# Patient Record
Sex: Male | Born: 1945 | Race: White | Hispanic: No | Smoking: Never smoker
Health system: Southern US, Community
[De-identification: ages and names within clinical notes are randomized; demographics above are authoritative.]

## PROBLEM LIST (undated history)

## (undated) DIAGNOSIS — E785 Hyperlipidemia, unspecified: Secondary | ICD-10-CM

## (undated) DIAGNOSIS — I10 Essential (primary) hypertension: Secondary | ICD-10-CM

## (undated) HISTORY — DX: Essential (primary) hypertension: I10

## (undated) HISTORY — DX: Hyperlipidemia, unspecified: E78.5

---

## 2006-12-21 ENCOUNTER — Emergency Department (HOSPITAL_COMMUNITY): Admission: EM | Admit: 2006-12-21 | Discharge: 2006-12-21 | Payer: Self-pay | Admitting: Emergency Medicine

## 2006-12-21 IMAGING — CT CT PELVIS W/O CM
2 of 4 series · 17 of 46 positions shown, 19 images · non-contrast
Comparison: none

[DATE] – DUPLICATE COPY for exam association in [facility] – No change from original report.
CLINICAL DATA: Left flank pain

[Series 2: renal stone 5.0 b31f st · axial · 0.77mm/px · z∈[-442,+2]mm · 14 of 99 slices shown, 16 images]
[im 5/99  soft-tissue]
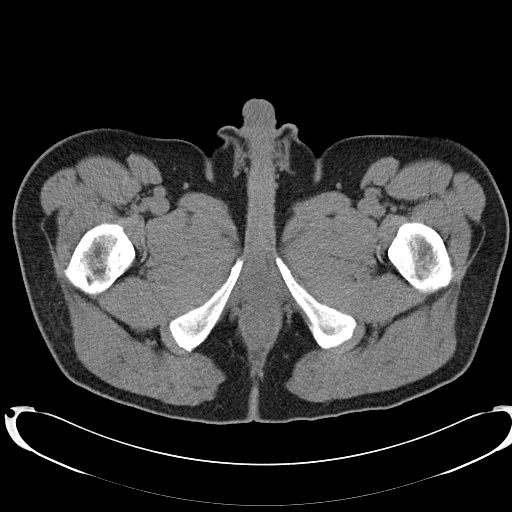
[im 5/99  bone]
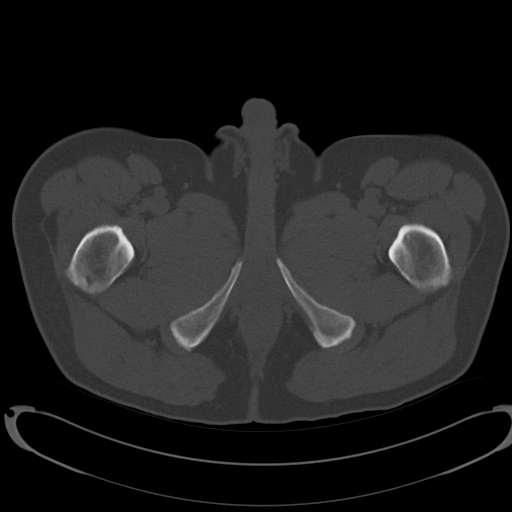
[im 13/99  soft-tissue]
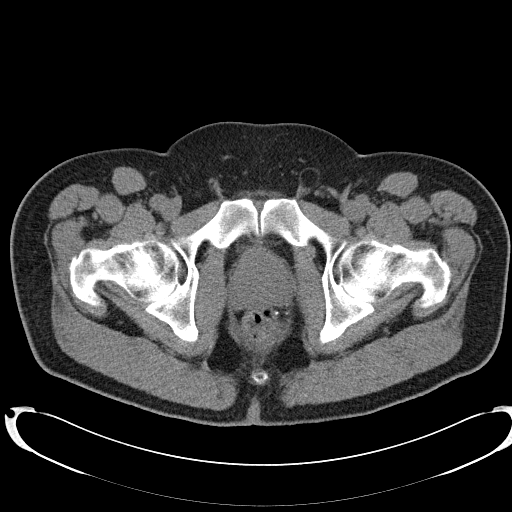
[im 18/99  soft-tissue]
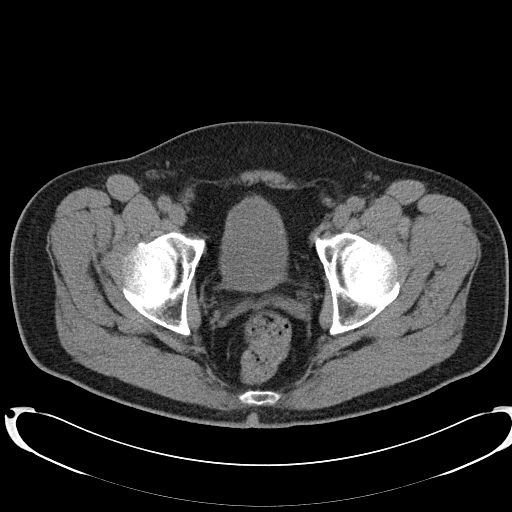
[im 26/99  soft-tissue]
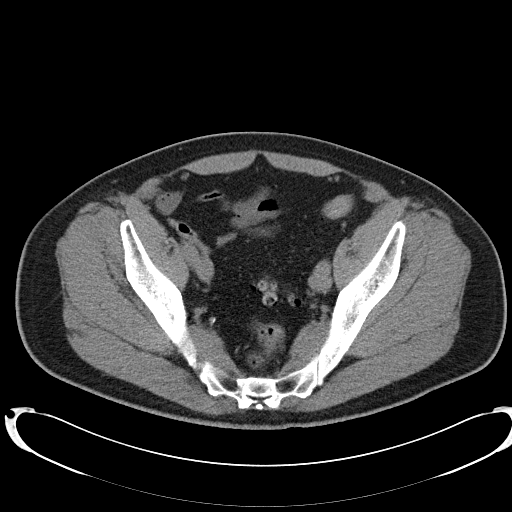
[im 35/99  soft-tissue]
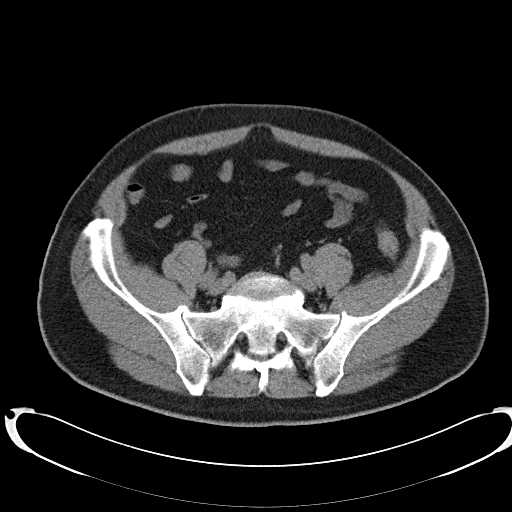
[im 39/99  soft-tissue]
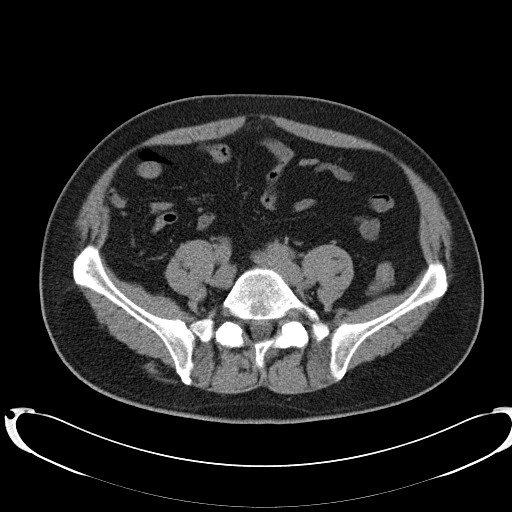
[im 47/99  soft-tissue]
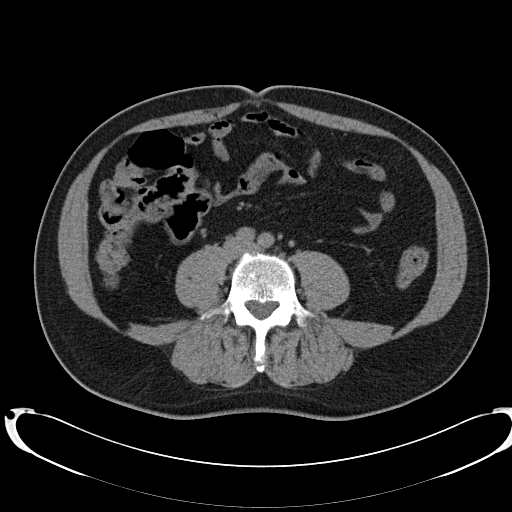
[im 52/99  soft-tissue]
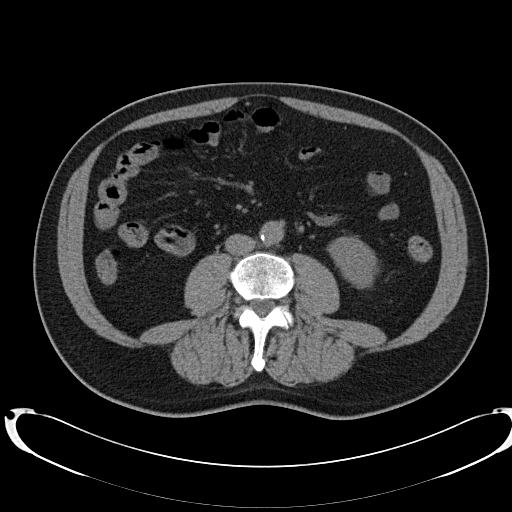
[im 60/99  soft-tissue]
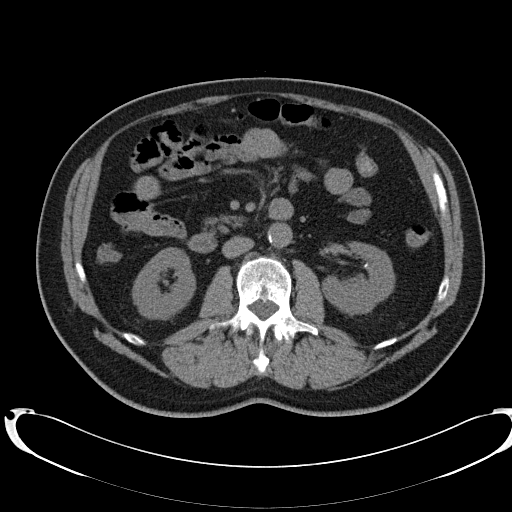
[im 60/99  bone]
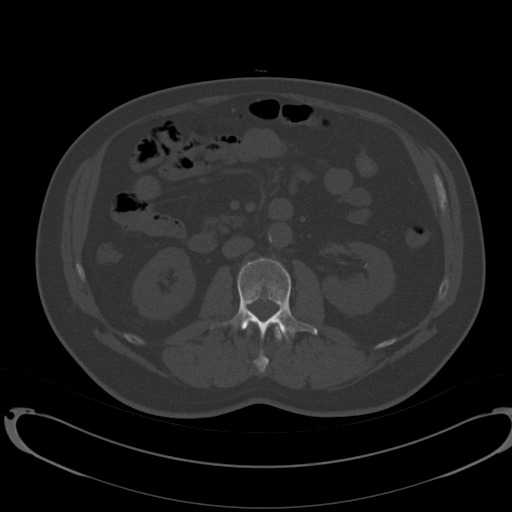
[im 64/99  soft-tissue]
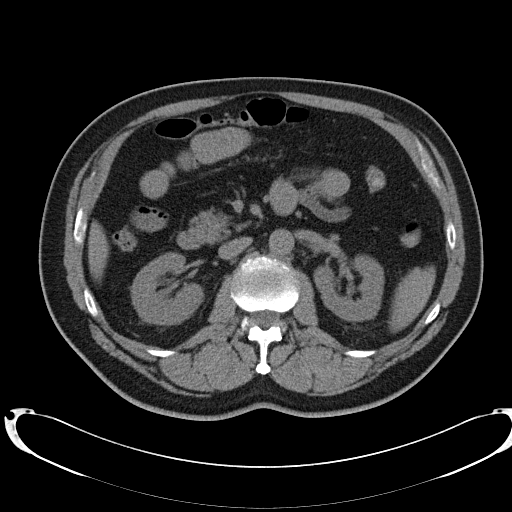
[im 73/99  soft-tissue]
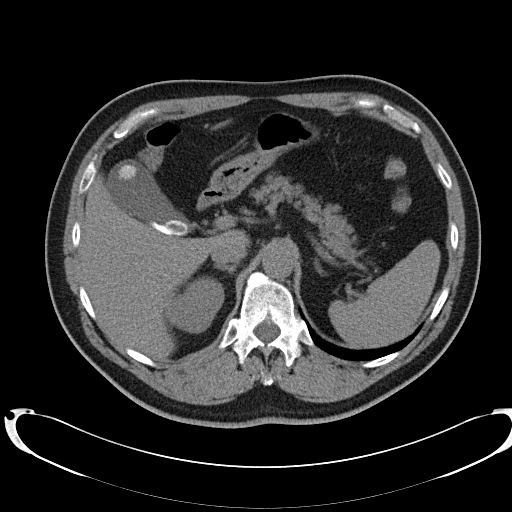
[im 81/99  soft-tissue]
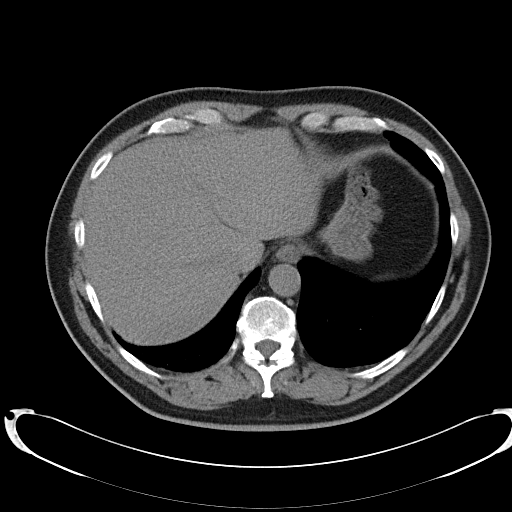
[im 86/99  soft-tissue]
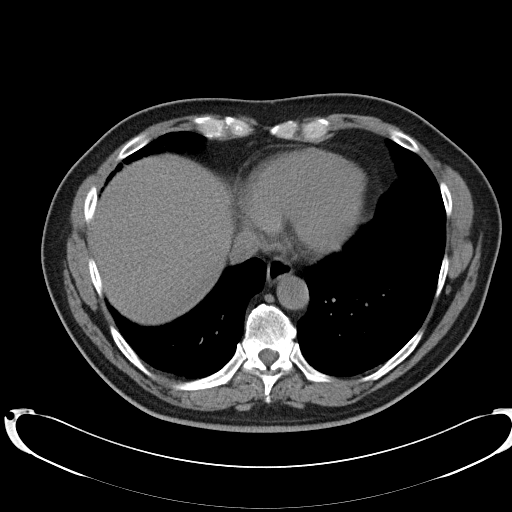
[im 94/99  soft-tissue]
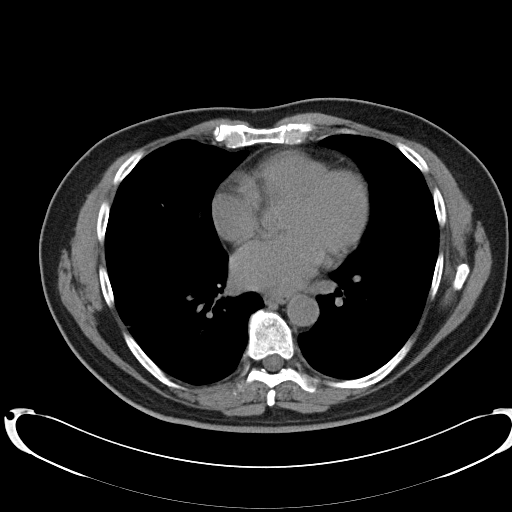

[Series 602: cor a/p · coronal · 0.97mm/px · 3 of 52 slices shown]
[im 18/52  soft-tissue]
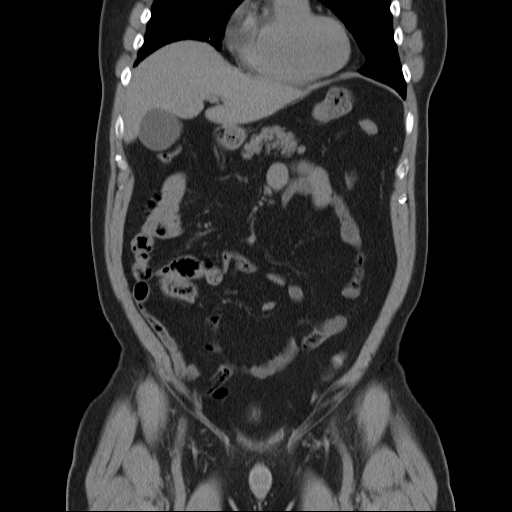
[im 23/52  soft-tissue]
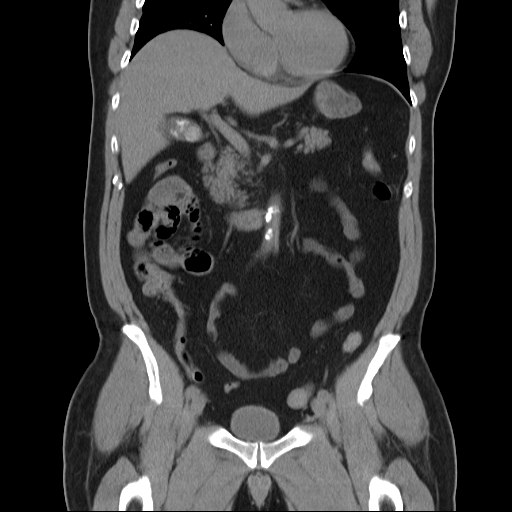
[im 29/52  soft-tissue]
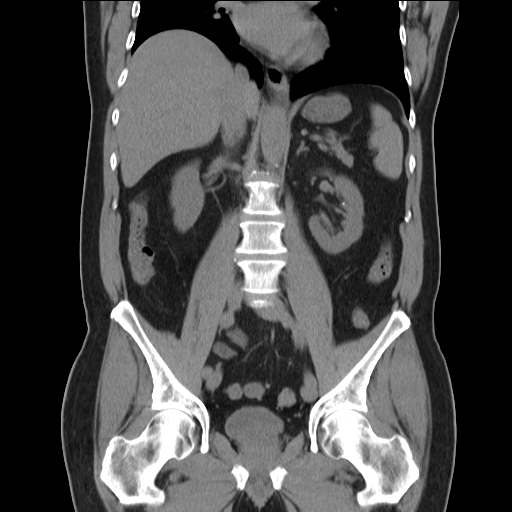

[17 of 46 positions shown; findings below may reference images not displayed]

CT abdomen without contrast:

 No previous for comparison. Dependent atelectasis in the posterior right lower
 lobe. Aortic and coronary calcifications noted. Partial calcified gallstones in
 the gallbladder. 3 mm calculus in the interpolar aspect of the left renal
 collecting system. No hydronephrosis or ureterectasis. Unremarkable noncontrast
 evaluation of spleen, liver, adrenal glands, pancreas, small bowel. No free air.
 No ascites. No adenopathy localized.
IMPRESSION: 1. Left nephrolithiasis without hydronephrosis.
 2. Cholelithiasis

 CT pelvis without contrast:

 Scattered descending and sigmoid diverticula without adjacent inflammatory or
 edematous change. Distal ureters decompressed without calculus. Urinary bladder
 incompletely distended. Moderate prostatic enlargement with central
 calcification. Bilateral pelvic vascular calcifications. No free fluid. No
 adenopathy localized.
IMPRESSION: 1. Descending and sigmoid diverticulosis without CT evidence of diverticulitis
 or abscess.
 2. Negative for distal ureteral calculus.

## 2006-12-21 IMAGING — CR DG ABDOMEN ACUTE W/ 1V CHEST
3 series · 3 of 3 positions shown · non-contrast
Comparison: CT abdomen and pelvis, same day.

CLINICAL DATA: Left-sided pain.
 ACUTE ABDOMINAL SERIES WITH CHEST ? 3 VIEWS ? [DATE]:

[w chest pa]
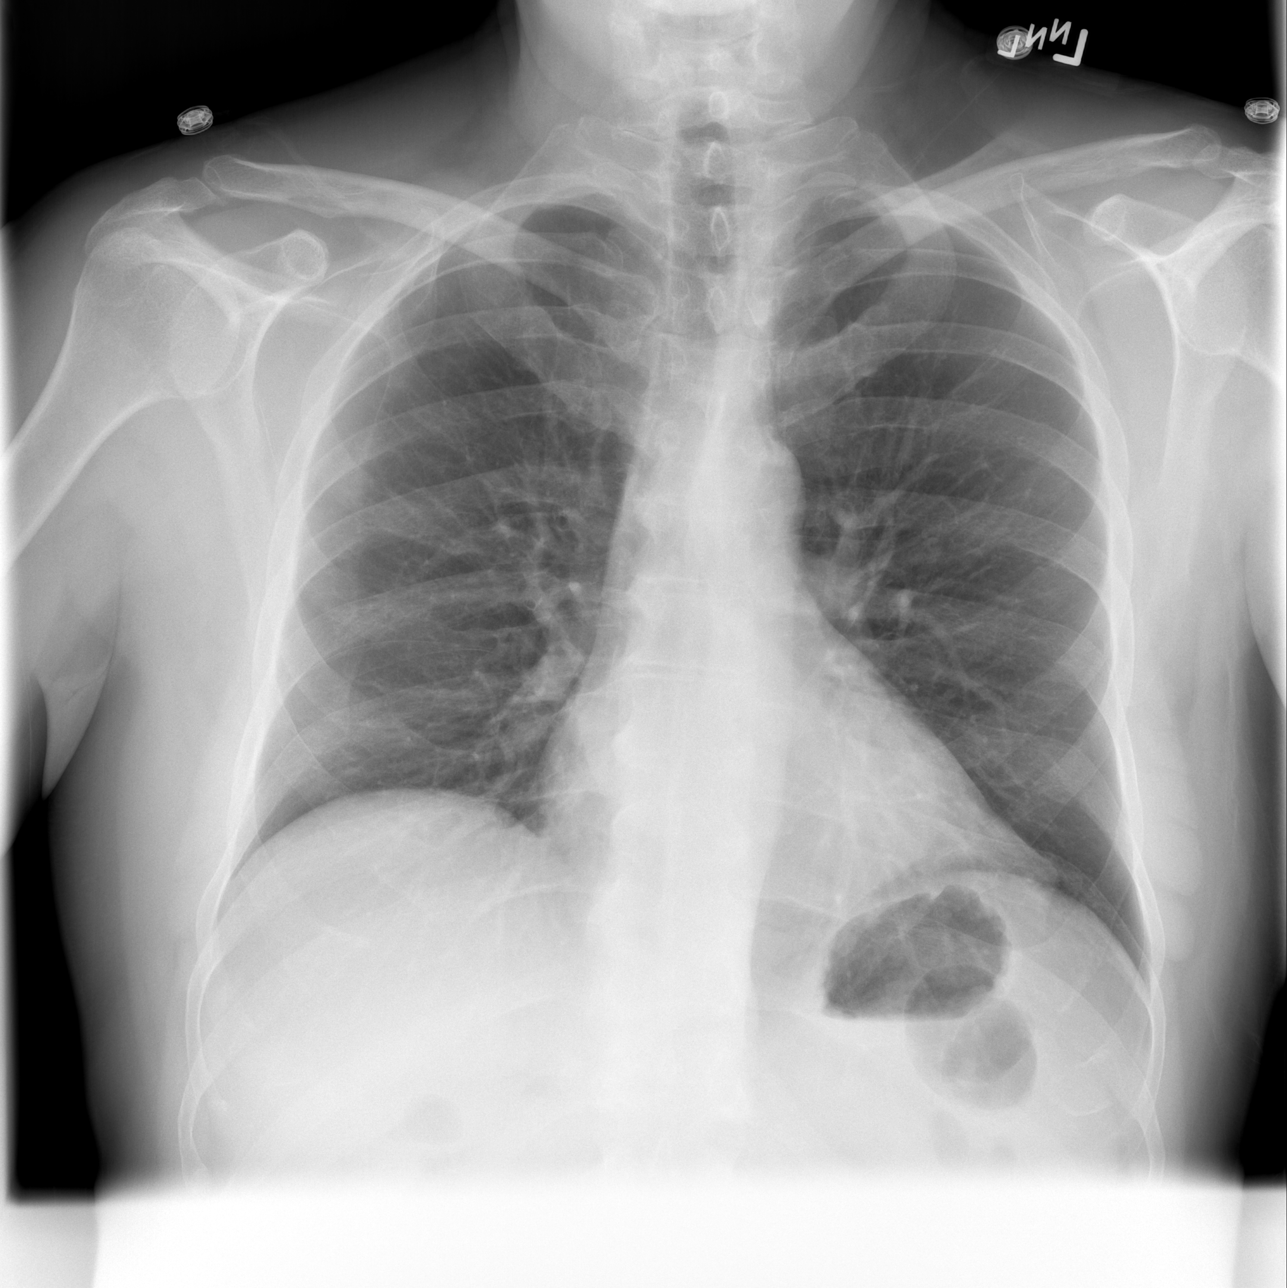

[w abdomen upright]
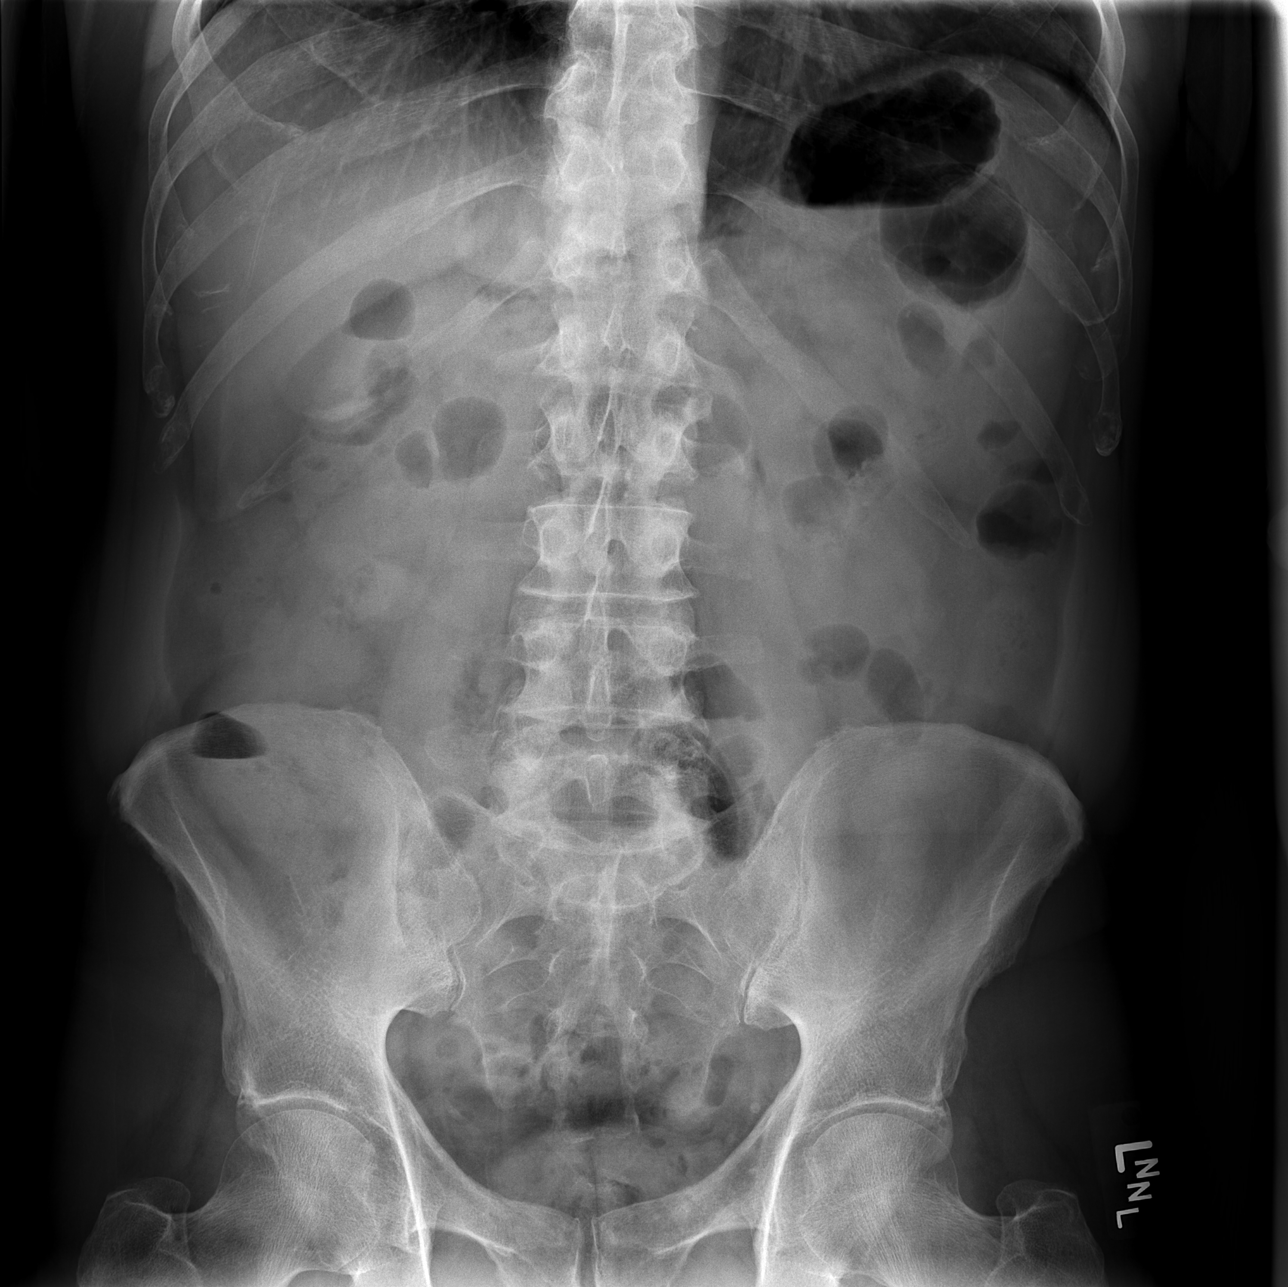

[t abdomen supine]
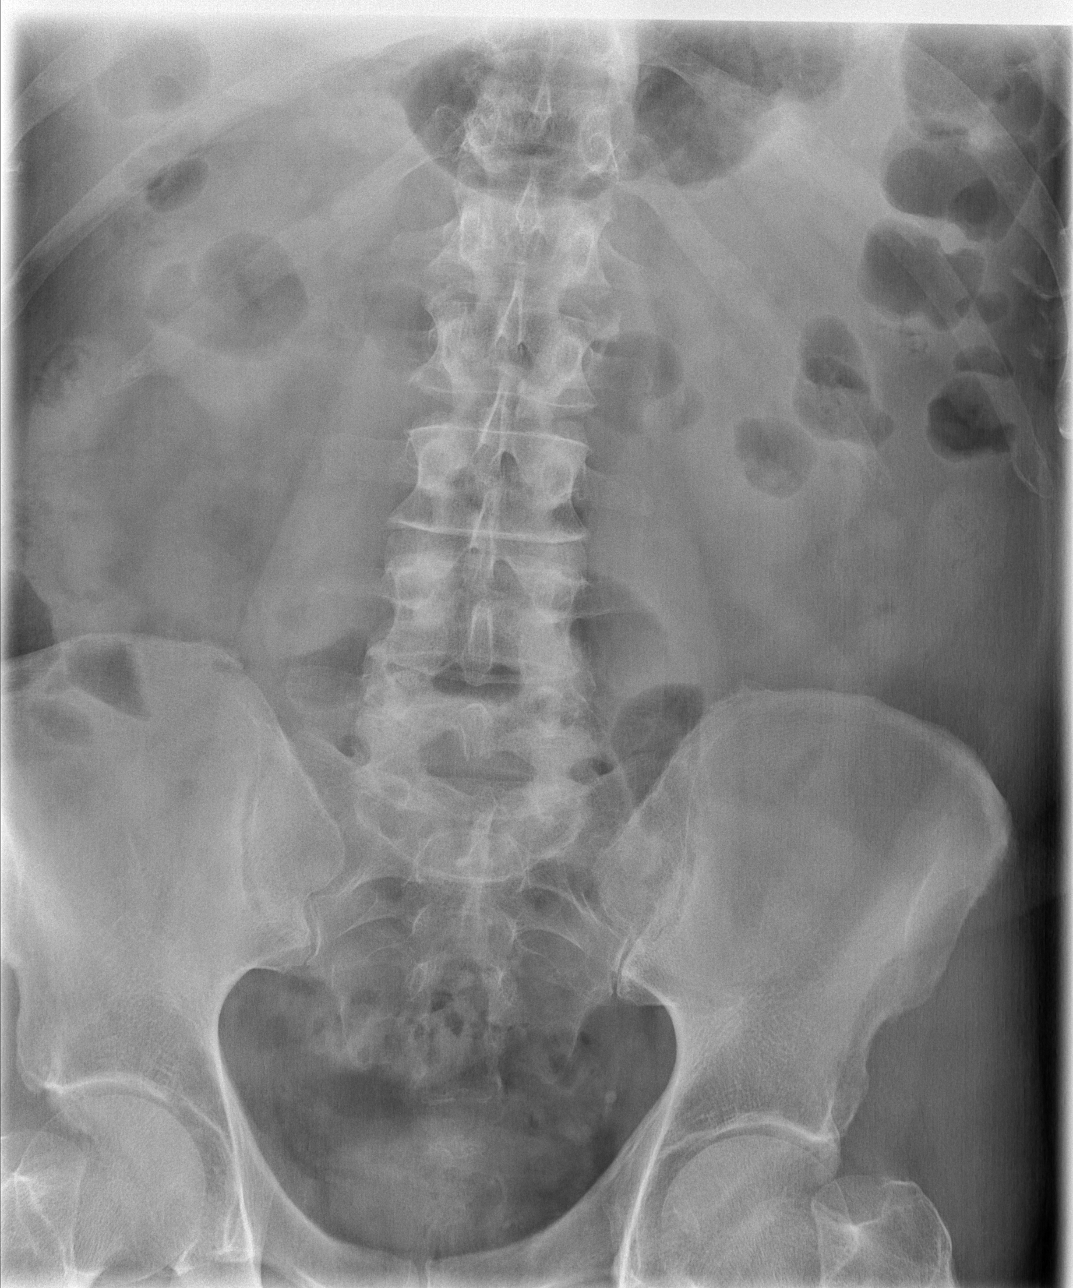

[3 of 3 positions shown; findings below may reference images not displayed]

FINDINGS: Normal mediastinum and cardiac silhouette. The costophrenic angles are clear.  Normal pulmonary vasculature.  No free air beneath the hemidiaphragms.  
 Supine and upright views of the abdomen demonstrate no dilated loops of small or large bowel.  Gas within the rectum.  Calcifications in the region of the gallbladder and midpole left kidney correspond to CT findings.
IMPRESSION: 1.  No acute cardiopulmonary process.
 2.  No evidence of bowel obstruction or free air.  
 3.  Likely gallstones and left renal nephrolithiasis corresponding to CT findings.

## 2010-12-27 LAB — DIFFERENTIAL
Basophils Absolute: 0
Eosinophils Relative: 1
Monocytes Absolute: 0.6
Monocytes Relative: 6

## 2010-12-27 LAB — COMPREHENSIVE METABOLIC PANEL
AST: 24
Alkaline Phosphatase: 53
BUN: 9
Creatinine, Ser: 0.9
GFR calc Af Amer: >60
GFR calc non Af Amer: >60
Glucose, Bld: 131 — ABNORMAL HIGH
Potassium: 3.7
Total Bilirubin: 1.2

## 2010-12-27 LAB — URINALYSIS, ROUTINE W REFLEX MICROSCOPIC
Nitrite: NEGATIVE
Protein, ur: 30 — AB
Specific Gravity, Urine: 1.028
Urobilinogen, UA: 1

## 2010-12-27 LAB — CBC
HCT: 46.8
MCV: 89.9
RBC: 5.2
RDW: 13.6
WBC: 10.2

## 2010-12-27 LAB — URINE MICROSCOPIC-ADD ON

## 2010-12-27 LAB — LIPASE, BLOOD: Lipase: 23

## 2015-04-12 ENCOUNTER — Ambulatory Visit (INDEPENDENT_AMBULATORY_CARE_PROVIDER_SITE_OTHER): Payer: Medicare Other | Admitting: Emergency Medicine

## 2015-04-12 ENCOUNTER — Emergency Department (HOSPITAL_COMMUNITY): Payer: Medicare Other

## 2015-04-12 ENCOUNTER — Ambulatory Visit: Payer: Self-pay

## 2015-04-12 ENCOUNTER — Ambulatory Visit (HOSPITAL_COMMUNITY): Payer: Self-pay

## 2015-04-12 ENCOUNTER — Emergency Department (HOSPITAL_COMMUNITY)
Admission: EM | Admit: 2015-04-12 | Discharge: 2015-04-12 | Disposition: A | Discharge status: Home / Self Care | Payer: Medicare Other | Attending: Emergency Medicine | Admitting: Emergency Medicine

## 2015-04-12 ENCOUNTER — Ambulatory Visit (INDEPENDENT_AMBULATORY_CARE_PROVIDER_SITE_OTHER): Payer: Medicare Other

## 2015-04-12 ENCOUNTER — Encounter (HOSPITAL_COMMUNITY): Payer: Self-pay | Admitting: *Deleted

## 2015-04-12 VITALS — BP 182/88 | HR 79 | Temp 97.6°F | Resp 18 | Ht 69.0 in | Wt 180.2 lb

## 2015-04-12 DIAGNOSIS — R030 Elevated blood-pressure reading, without diagnosis of hypertension: Secondary | ICD-10-CM

## 2015-04-12 DIAGNOSIS — N200 Calculus of kidney: Secondary | ICD-10-CM | POA: Diagnosis not present

## 2015-04-12 DIAGNOSIS — R1032 Left lower quadrant pain: Secondary | ICD-10-CM

## 2015-04-12 DIAGNOSIS — E785 Hyperlipidemia, unspecified: Secondary | ICD-10-CM | POA: Insufficient documentation

## 2015-04-12 DIAGNOSIS — N201 Calculus of ureter: Secondary | ICD-10-CM | POA: Diagnosis not present

## 2015-04-12 DIAGNOSIS — R109 Unspecified abdominal pain: Secondary | ICD-10-CM

## 2015-04-12 DIAGNOSIS — IMO0001 Reserved for inherently not codable concepts without codable children: Secondary | ICD-10-CM

## 2015-04-12 DIAGNOSIS — I10 Essential (primary) hypertension: Secondary | ICD-10-CM | POA: Insufficient documentation

## 2015-04-12 DIAGNOSIS — Z79899 Other long term (current) drug therapy: Secondary | ICD-10-CM | POA: Insufficient documentation

## 2015-04-12 DIAGNOSIS — R739 Hyperglycemia, unspecified: Secondary | ICD-10-CM | POA: Diagnosis not present

## 2015-04-12 DIAGNOSIS — E119 Type 2 diabetes mellitus without complications: Secondary | ICD-10-CM | POA: Insufficient documentation

## 2015-04-12 DIAGNOSIS — K8020 Calculus of gallbladder without cholecystitis without obstruction: Secondary | ICD-10-CM

## 2015-04-12 DIAGNOSIS — R110 Nausea: Secondary | ICD-10-CM

## 2015-04-12 LAB — BASIC METABOLIC PANEL
Anion gap: 11 (ref 5–15)
BUN: 19 mg/dL (ref 6–20)
CHLORIDE: 103 mmol/L (ref 101–111)
CO2: 25 mmol/L (ref 22–32)
CREATININE: 1.18 mg/dL (ref 0.61–1.24)
Calcium: 9.1 mg/dL (ref 8.9–10.3)
GFR calc non Af Amer: >60 mL/min (ref >60)
Glucose, Bld: 218 mg/dL — ABNORMAL HIGH (ref 65–99)
POTASSIUM: 4 mmol/L (ref 3.5–5.1)
Sodium: 139 mmol/L (ref 135–145)

## 2015-04-12 LAB — POCT CBC
GRANULOCYTE PERCENT: 90 % — AB (ref 37–80)
HCT, POC: 47.8 % (ref 43.5–53.7)
Hemoglobin: 16.9 g/dL (ref 14.1–18.1)
Lymph, poc: 0.9 (ref 0.6–3.4)
MCH: 30.9 pg (ref 27–31.2)
MCHC: 35.3 g/dL (ref 31.8–35.4)
MCV: 87.4 fL (ref 80–97)
MID (cbc): 0.4 (ref 0–0.9)
MPV: 7.5 fL (ref 0–99.8)
PLATELET COUNT, POC: 256 10*3/uL (ref 142–424)
POC Granulocyte: 11.4 — AB (ref 2–6.9)
POC LYMPH PERCENT: 7 %L — AB (ref 10–50)
POC MID %: 3 %M (ref 0–12)
RBC: 5.46 M/uL (ref 4.69–6.13)
RDW, POC: 13.7 %
WBC: 12.7 10*3/uL — AB (ref 4.6–10.2)

## 2015-04-12 LAB — POC MICROSCOPIC URINALYSIS (UMFC): Mucus: ABSENT

## 2015-04-12 LAB — COMPREHENSIVE METABOLIC PANEL
ALT: 18 U/L (ref 9–46)
AST: 21 U/L (ref 10–35)
Albumin: 4.8 g/dL (ref 3.6–5.1)
Alkaline Phosphatase: 59 U/L (ref 40–115)
BILIRUBIN TOTAL: 1.2 mg/dL (ref 0.2–1.2)
BUN: 17 mg/dL (ref 7–25)
CO2: 27 mmol/L (ref 20–31)
CREATININE: 1.06 mg/dL (ref 0.70–1.18)
Calcium: 9.5 mg/dL (ref 8.6–10.3)
Chloride: 98 mmol/L (ref 98–110)
GLUCOSE: 232 mg/dL — AB (ref 65–99)
Potassium: 4 mmol/L (ref 3.5–5.3)
SODIUM: 137 mmol/L (ref 135–146)
Total Protein: 7.8 g/dL (ref 6.1–8.1)

## 2015-04-12 LAB — POCT URINALYSIS DIP (MANUAL ENTRY)
Bilirubin, UA: NEGATIVE
Glucose, UA: 500 — AB
Leukocytes, UA: NEGATIVE
Nitrite, UA: NEGATIVE
SPEC GRAV UA: 1.02
UROBILINOGEN UA: 1
pH, UA: 7

## 2015-04-12 LAB — GLUCOSE, POCT (MANUAL RESULT ENTRY): POC Glucose: 236 mg/dl — AB (ref 70–99)

## 2015-04-12 IMAGING — CR DG ABDOMEN ACUTE W/ 1V CHEST
3 series · 3 of 3 positions shown · non-contrast
Comparison: None in PACs

CLINICAL DATA: Nausea vomiting and left flank pain since last night

EXAM:
DG ABDOMEN ACUTE W/ 1V CHEST

[PA]
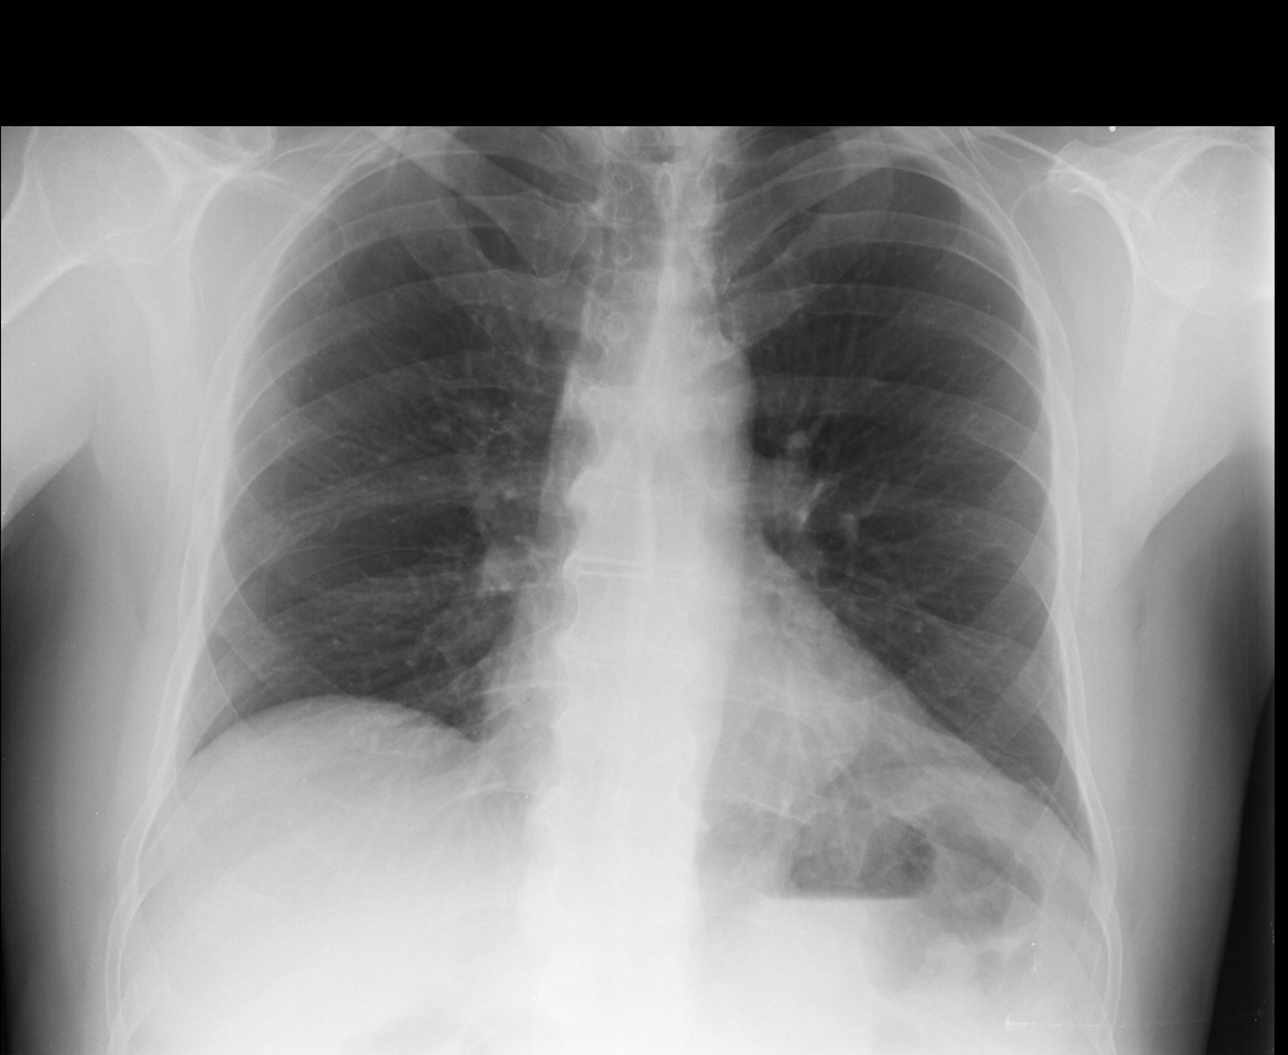

[AP (1 of 2)]
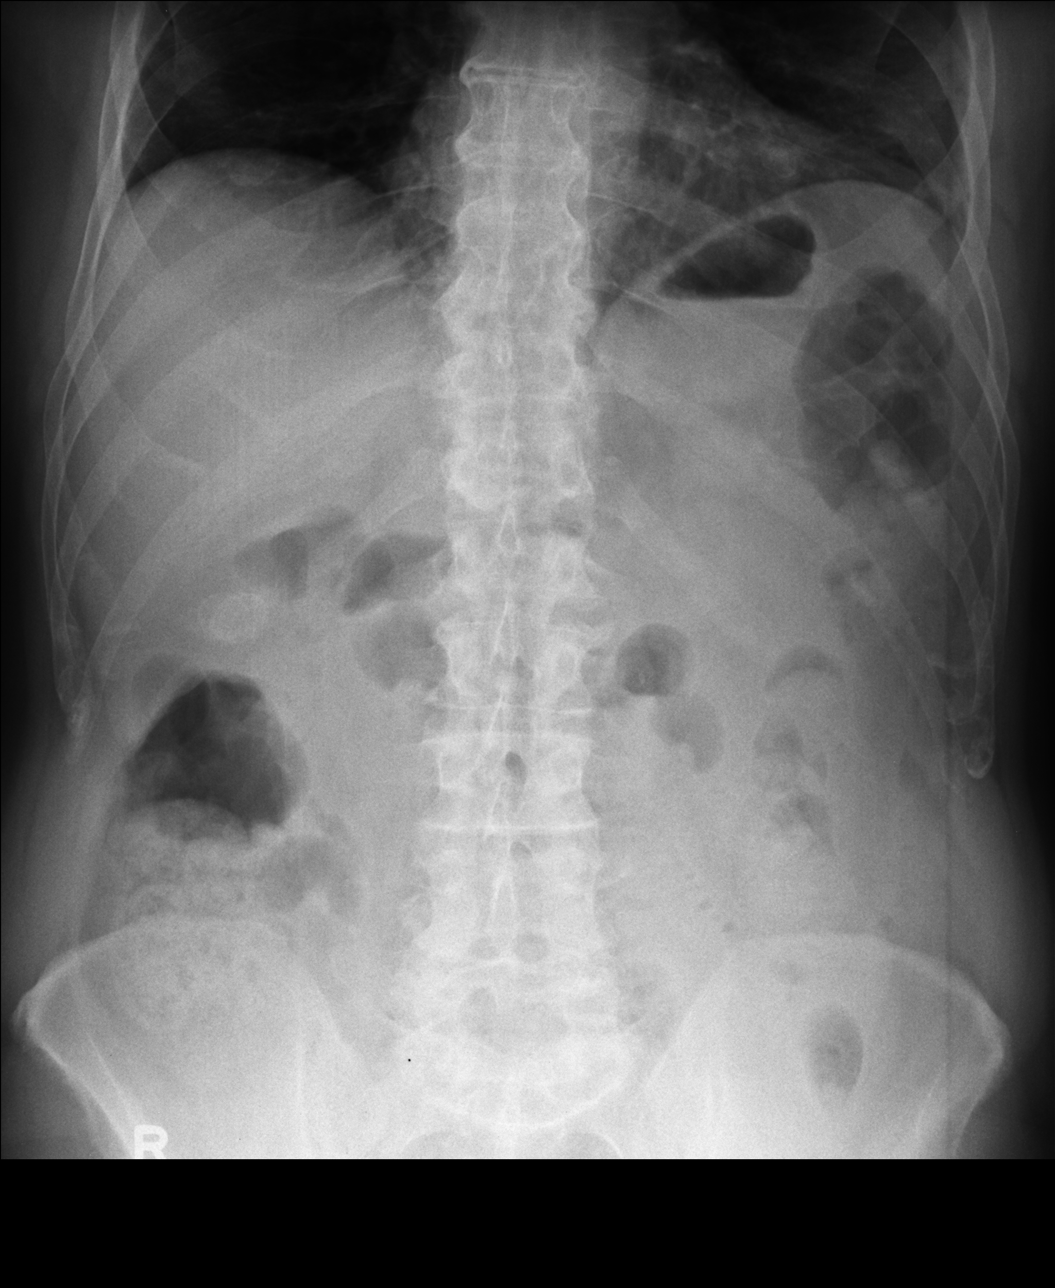

[AP (2 of 2)]
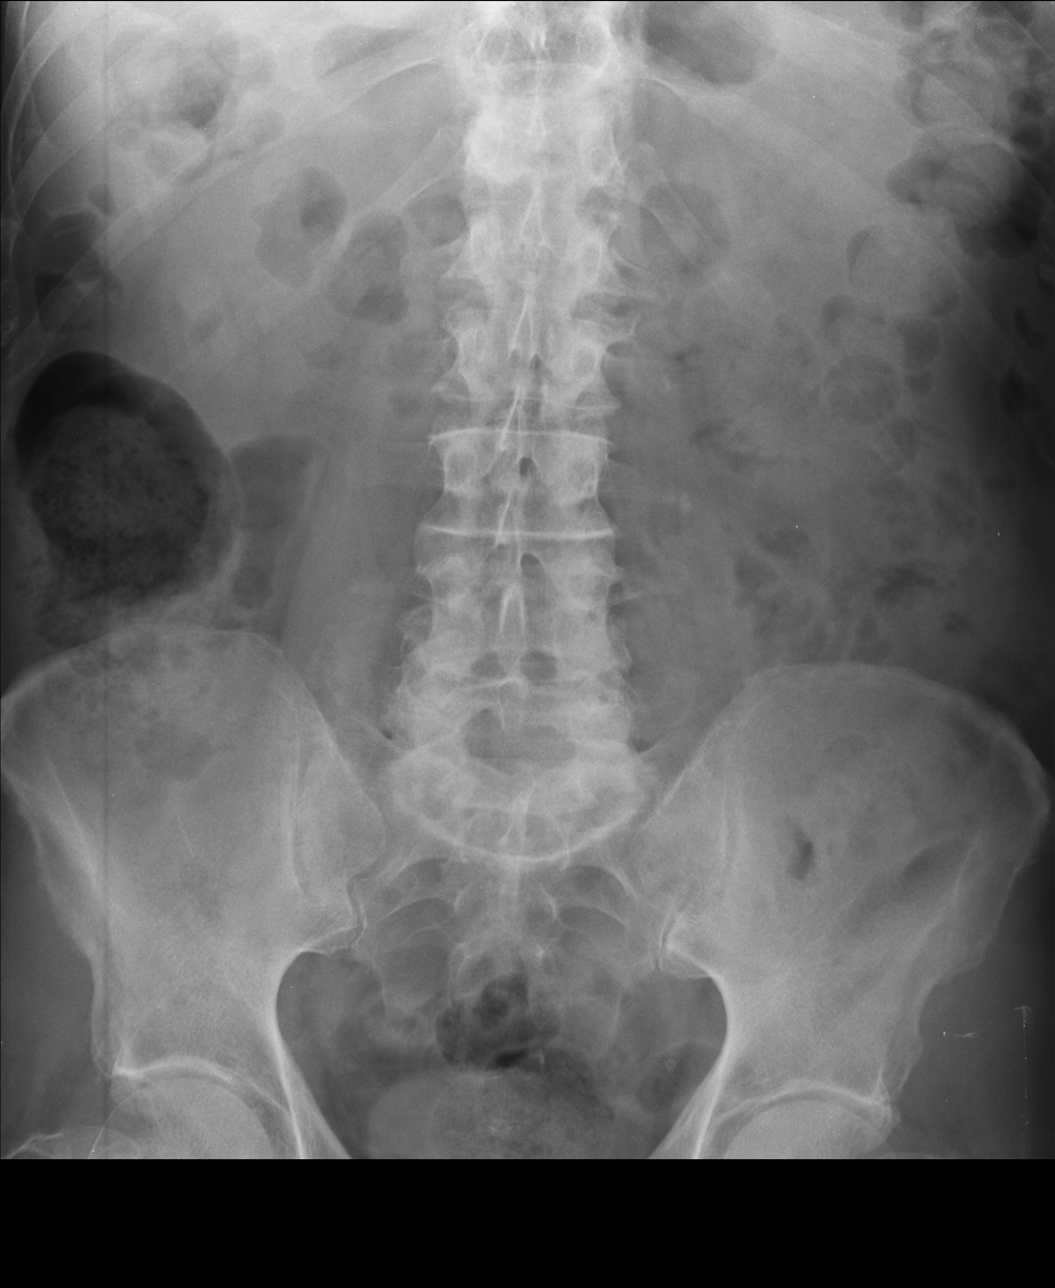

[3 of 3 positions shown; findings below may reference images not displayed]

FINDINGS: Chest x-ray: The lungs are well-expanded and clear. The heart and
mediastinal structures are normal. There is no pleural effusion.
There is mild degenerative disc disease at multiple thoracic levels.

Supine and upright abdominal images: The bowel gas pattern is within
the limits of normal. There is a rounded calcific density in the
right mid upper abdomen that may lie within the gallbladder. On the
left there is an irregular calcification just lateral to the tip of
the left L3 transverse process. It measures approximately 3 x 8 mm.
No definite soft tissue calcifications are noted elsewhere. There is
mild degenerative disc change in the lower lumbar spine with facet
joint hypertrophy.
IMPRESSION: 1. Probable 3 x 8 mm calcified stone in the proximal left ureter at
approximately the L3 level. Possible gallstones.
2. No acute cardiopulmonary abnormality.

## 2015-04-12 IMAGING — CT CT RENAL STONE PROTOCOL
2 of 3 series · 15 of 40 positions shown, 17 images · non-contrast
Comparison: [DATE]

CLINICAL DATA: Left flank pain and elevated white blood cell count

EXAM:
CT ABDOMEN AND PELVIS WITHOUT CONTRAST
TECHNIQUE: Multidetector CT imaging of the abdomen and pelvis was performed
following the standard protocol without oral or intravenous contrast
material administration.

[Series 4: lung · axial · 0.74mm/px · z∈[+1309,+1419]mm · 12 of 26 slices shown, 14 images]
[im 3/26  soft-tissue]
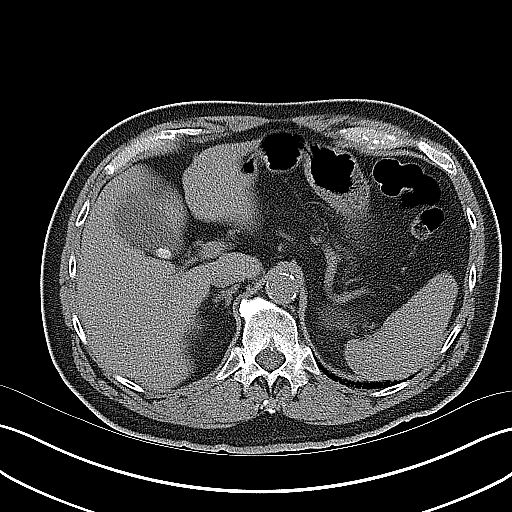
[im 3/26  bone]
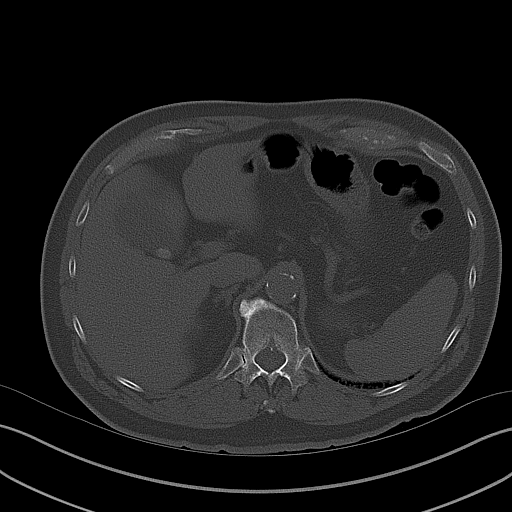
[im 5/26  soft-tissue]
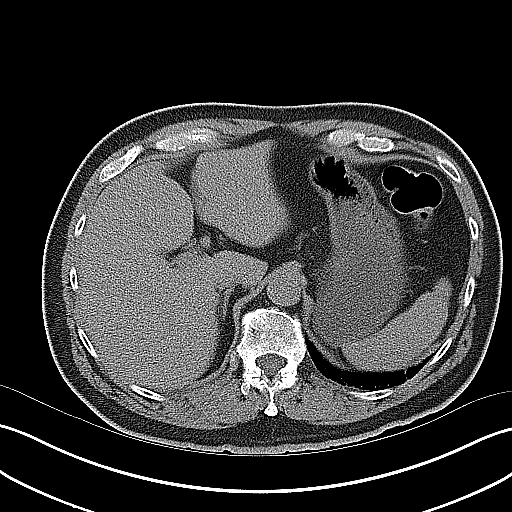
[im 7/26  soft-tissue]
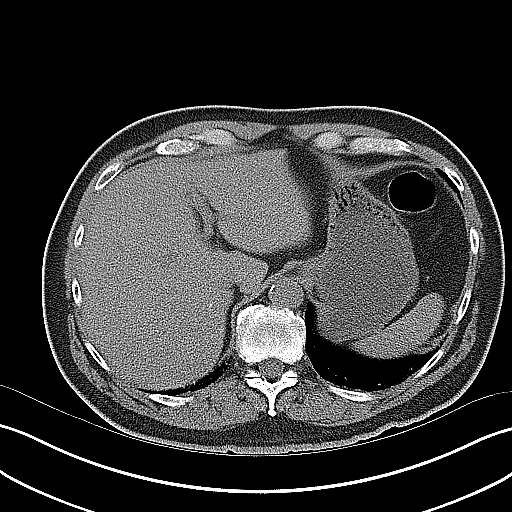
[im 9/26  soft-tissue]
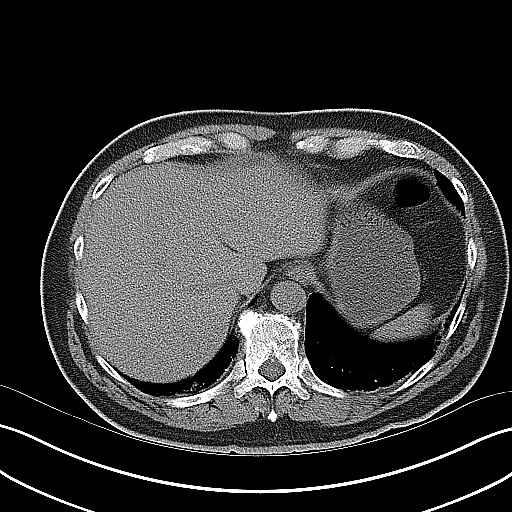
[im 11/26  soft-tissue]
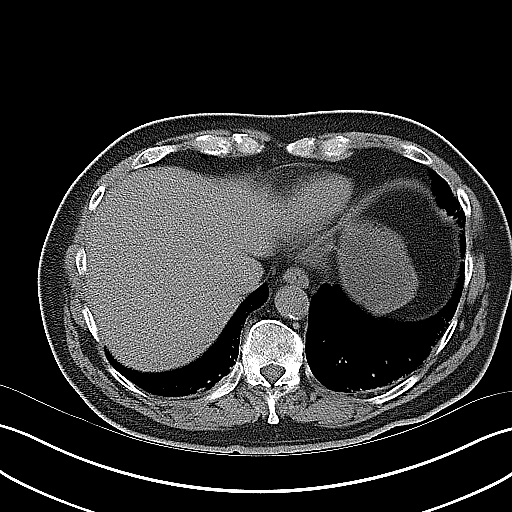
[im 13/26  soft-tissue]
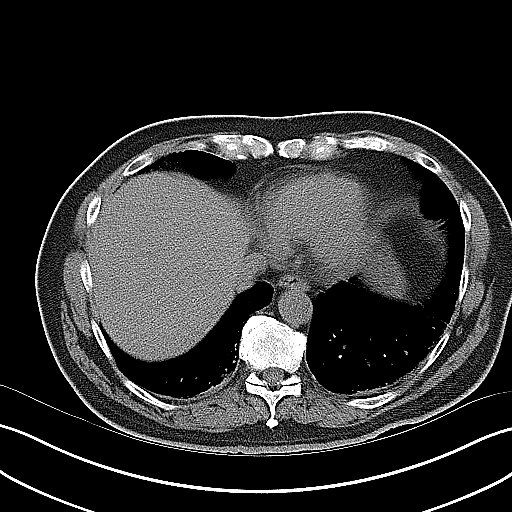
[im 15/26  soft-tissue]
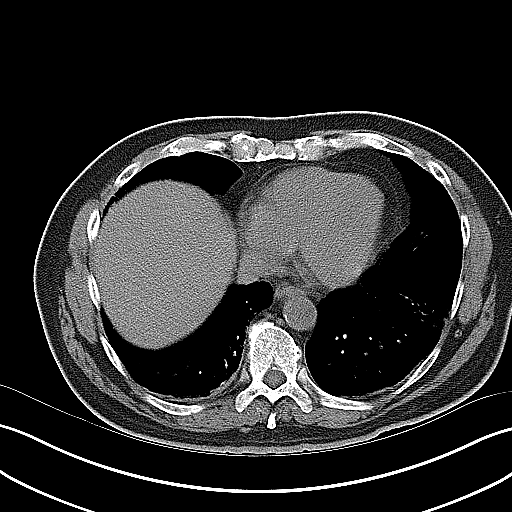
[im 17/26  soft-tissue]
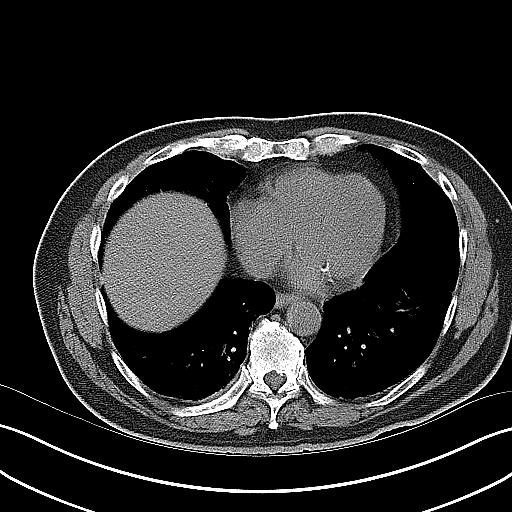
[im 19/26  soft-tissue]
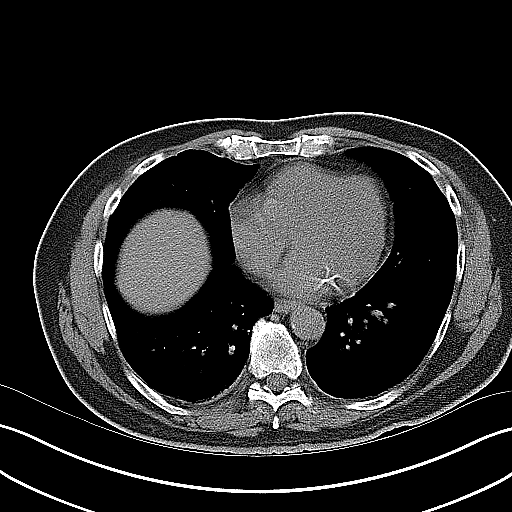
[im 19/26  bone]
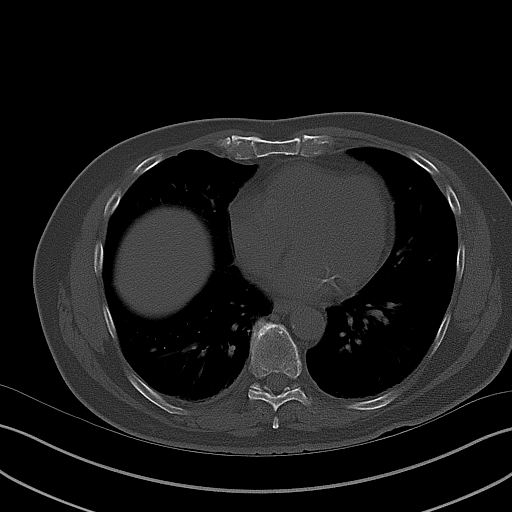
[im 21/26  soft-tissue]
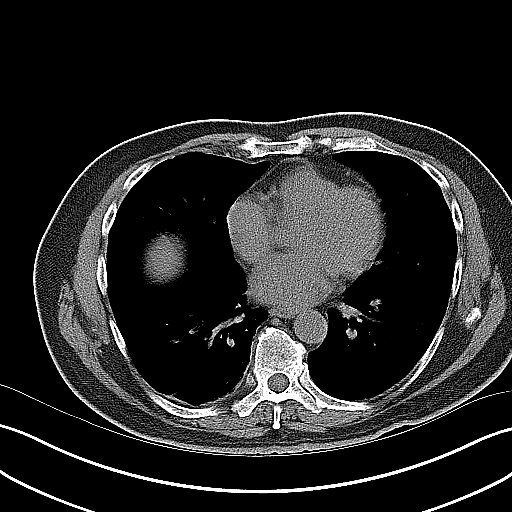
[im 23/26  soft-tissue]
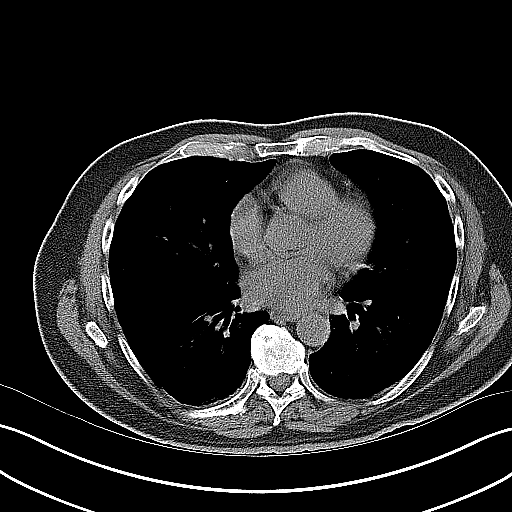
[im 25/26  soft-tissue]
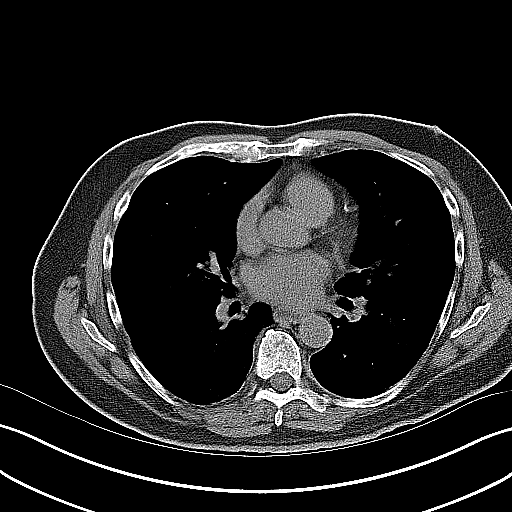

[Series 5: coronal · coronal · 0.72mm/px · 3 of 92 slices shown]
[im 31/92  soft-tissue]
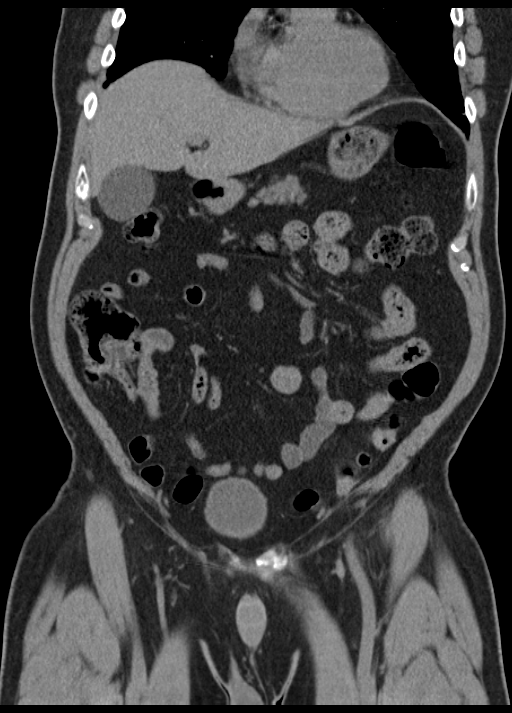
[im 41/92  soft-tissue]
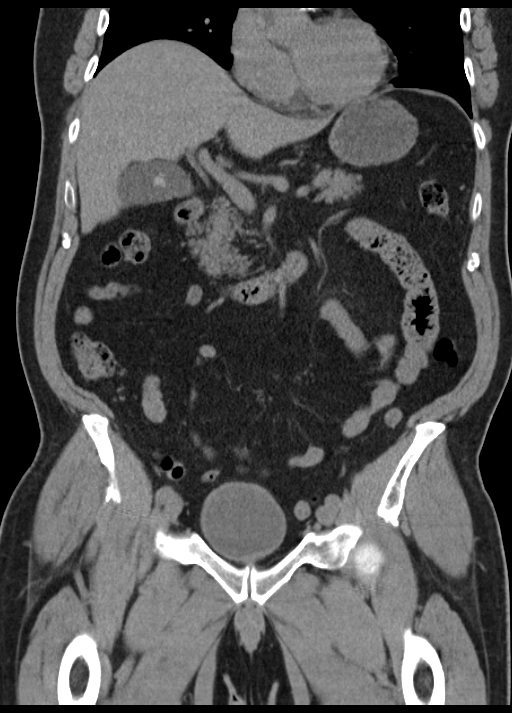
[im 51/92  soft-tissue]
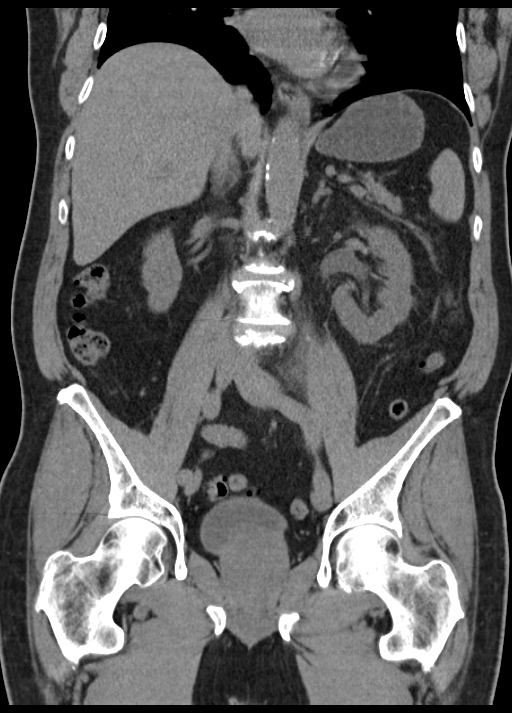

[15 of 40 positions shown; findings below may reference images not displayed]

FINDINGS: Lower chest: There is bibasilar atelectatic change. Lung bases
otherwise are clear. There is extensive coronary artery
calcification.

Hepatobiliary: No focal liver lesions are identified on this
noncontrast enhanced study beyond a tiny granuloma in the right lobe
of the liver medially. There is cholelithiasis with laminated
gallstones in the gallbladder. Gallbladder wall is not thickened.
There is no biliary duct dilatation.

Pancreas: No pancreatic mass or inflammatory focus.

Spleen: No splenic lesions are identified.

Adrenals/Urinary Tract: Adrenals appear unremarkable bilaterally.
There is no renal mass on either side. There is mild to moderate
hydronephrosis on the left with left-sided perinephric stranding.
There is no hydronephrosis on the right. There is no intrarenal
calculus in either kidney. There is no right-sided ureteral
calculus. There is a calculus in the left ureter at the level of L4
measuring 5 x 4 mm. No other ureteral calculi are identified.
Urinary bladder is midline with wall thickness within normal limits.

Stomach/Bowel: There are multiple sigmoid diverticula without
diverticulitis. There is no bowel wall or mesenteric thickening. No
bowel obstruction. No free air or portal venous air.

Vascular/Lymphatic: There are atherosclerotic calcifications in the
aorta and right common iliac artery. No abdominal aortic aneurysm.
The major mesenteric vessels do not show significant calcification.
No vascular lesions are identified on this noncontrast enhanced
study. There is no demonstrable adenopathy in the abdomen or pelvis.

Reproductive: Prostate is enlarged, measuring 6.6 x 5.9 x 5.1 cm.
There is loss of fat plane between the inferior urinary bladder and
the prostate. Seminal vesicles appear unremarkable. There is no
pelvic mass or pelvic fluid collection.

Other: Appendix appears normal. No abscess or ascites in the abdomen
or pelvis. There is moderate fat in the left inguinal ring. There is
milder fat in the right inguinal ring.

Musculoskeletal: There is degenerative change in the lower thoracic
and lumbar regions. There are no blastic or lytic bone lesions.
There is no intramuscular or abdominal wall lesions.
IMPRESSION: There is a 5 x 4 mm calculus in the proximal left ureter with
hydronephrosis on the left. There is perinephric stranding on the
left as well. No intrarenal calculi on either side.

Enlarged prostate with loss of fat plane between the inferior
bladder and prostate. Correlation with PSA advised. Given the
findings at the base of the urinary bladder, direct visualization
may well be warranted as well.

Multiple sigmoid diverticula without diverticulitis. No bowel
obstruction. No abscess or ascites. Appendix appears normal.

Extensive coronary artery calcification.

Cholelithiasis.

## 2015-04-12 MED ORDER — SODIUM CHLORIDE 0.9 % IV BOLUS (SEPSIS)
500.0000 mL | Freq: Once | INTRAVENOUS | Status: AC
  Administered 2015-04-12: 500 mL via INTRAVENOUS

## 2015-04-12 MED ORDER — HYDROCODONE-ACETAMINOPHEN 5-325 MG PO TABS: 1.0000 | ORAL_TABLET | Freq: Four times a day (QID) | ORAL | Status: DC | PRN

## 2015-04-12 MED ORDER — ONDANSETRON 4 MG PO TBDP: 4.0000 mg | ORAL_TABLET | Freq: Three times a day (TID) | ORAL | Status: DC | PRN

## 2015-04-12 MED ORDER — HYDROMORPHONE HCL 1 MG/ML IJ SOLN
1.0000 mg | Freq: Once | INTRAMUSCULAR | Status: AC
  Administered 2015-04-12: 1 mg via INTRAVENOUS
  Filled 2015-04-12: qty 1

## 2015-04-12 MED ORDER — SODIUM CHLORIDE 0.9 % IV SOLN
INTRAVENOUS | Status: DC
  Administered 2015-04-12: 18:00:00 via INTRAVENOUS

## 2015-04-12 MED ORDER — ONDANSETRON HCL 4 MG/2ML IJ SOLN
4.0000 mg | Freq: Once | INTRAMUSCULAR | Status: AC
  Administered 2015-04-12: 4 mg via INTRAVENOUS
  Filled 2015-04-12: qty 2

## 2015-04-12 MED ORDER — KETOROLAC TROMETHAMINE 30 MG/ML IJ SOLN
30.0000 mg | Freq: Once | INTRAMUSCULAR | Status: AC
  Administered 2015-04-12: 30 mg via INTRAMUSCULAR

## 2015-04-12 MED ORDER — NAPROXEN 500 MG PO TABS: 500.0000 mg | ORAL_TABLET | Freq: Two times a day (BID) | ORAL | Status: DC

## 2015-04-12 NOTE — ED Notes (Signed)
Blue, light green, and lavender save tubes sent to the lab.

## 2015-04-12 NOTE — ED Notes (Signed)
Bed: YQ03 Expected date:  Expected time:  Means of arrival:  Comments: EMS- 69yo M, flank pain/nausea/kidney stone

## 2015-04-12 NOTE — ED Notes (Signed)
Patient was at [facility] for left flank pain. Patient was told there that he has a kidney stone to the left kidney and an elevated white count. Patient was sent here from there.

## 2015-04-12 NOTE — ED Provider Notes (Signed)
CSN: [id number]     Arrival date & time 04/12/15  1430 History   First MD Initiated Contact with Patient 04/12/15 1508     Chief Complaint  Patient presents with  . Flank Pain     (Consider location/radiation/quality/duration/timing/severity/associated sxs/prior Treatment) Patient is a 70 y.o. male presenting with flank pain. The history is provided by the patient.  Flank Pain Associated symptoms include abdominal pain. Pertinent negatives include no chest pain, no headaches and no shortness of breath.  patient with acute onset of left flank pain at 9 p.m. yesterday. Pain persisted throughout the night. Patient went to urgent care. Had x-rays done there and blood work suspicious for left-sided kidney stone. Referred here for further evaluation. Patient treated there with Toradol 30 mg IM. Patient had some improvement with that. But patient stated he was starting to improve prior to that. Pain is now reoccurred. Pain is 10 out of 10. Associated with nausea and vomiting. Patient had episodes of vomiting overnight. No known history of kidney stones in the past. But many years ago had pain that was similar and was suspected that maybe it was a kidney stone.  Past Medical History  Diagnosis Date  . Hyperlipidemia   . Hypertension    No past surgical history on file. Family History  Problem Relation Age of Onset  . Alzheimer's disease Mother   . Heart attack Father   . Parkinson's disease Brother    Social History  Substance Use Topics  . Smoking status: Never Smoker   . Smokeless tobacco: None  . Alcohol Use: No    Review of Systems  Constitutional: Negative for fever.  HENT: Negative for congestion.   Eyes: Negative for visual disturbance.  Respiratory: Negative for shortness of breath.   Cardiovascular: Negative for chest pain.  Gastrointestinal: Positive for nausea, vomiting and abdominal pain.  Genitourinary: Positive for flank pain. Negative for hematuria.  Musculoskeletal:  Negative for back pain.  Skin: Negative for rash.  Neurological: Negative for headaches.  Hematological: Does not bruise/bleed easily.  Psychiatric/Behavioral: Negative for confusion.      Allergies  Review of patient's allergies indicates no known allergies.  Home Medications   Prior to Admission medications   Medication Sig Start Date End Date Taking? Authorizing Provider  hydrochlorothiazide (HYDRODIURIL) 25 MG tablet Take 25 mg by mouth daily.   Yes Historical Provider, MD  metoprolol succinate (TOPROL-XL) 50 MG 24 hr tablet Take 50 mg by mouth daily. Take with or immediately following a meal.   Yes Historical Provider, MD  rosuvastatin (CRESTOR) 10 MG tablet Take 10 mg by mouth daily.   Yes Historical Provider, MD  HYDROcodone-acetaminophen (NORCO/VICODIN) 5-325 MG tablet Take 1-2 tablets by mouth every 6 (six) hours as needed for moderate pain. 04/12/15   Vanetta Mulders, MD  naproxen (NAPROSYN) 500 MG tablet Take 1 tablet (500 mg total) by mouth 2 (two) times daily. 04/12/15   Vanetta Mulders, MD  ondansetron (ZOFRAN ODT) 4 MG disintegrating tablet Take 1 tablet (4 mg total) by mouth every 8 (eight) hours as needed for nausea or vomiting. 04/12/15   Vanetta Mulders, MD   BP 133/84 mmHg  Pulse 84  Temp(Src) 97.9 F (36.6 C) (Oral)  Resp 16  SpO2 96% Physical Exam  Constitutional: He is oriented to person, place, and time. He appears well-developed and well-nourished. No distress.  HENT:  Head: Normocephalic and atraumatic.  Mouth/Throat: Oropharynx is clear and moist.  Eyes: Conjunctivae and EOM are normal. Pupils are equal,  round, and reactive to light.  Neck: Normal range of motion.  Cardiovascular: Normal rate, regular rhythm and normal heart sounds.   Pulmonary/Chest: Effort normal and breath sounds normal. No respiratory distress.  Abdominal: Soft. There is no tenderness.  Musculoskeletal: Normal range of motion.  Neurological: He is alert and oriented to person,  place, and time. No cranial nerve deficit. He exhibits normal muscle tone.  Skin: Skin is warm.  Nursing note and vitals reviewed.   ED Course  Procedures (including critical care time) Labs Review Labs Reviewed  BASIC METABOLIC PANEL - Abnormal; Notable for the following:    Glucose, Bld 218 (*)    All other components within normal limits   Results for orders placed or performed during the hospital encounter of 04/12/15  Basic metabolic panel  Result Value Ref Range   Sodium 139 135 - 145 mmol/L   Potassium 4.0 3.5 - 5.1 mmol/L   Chloride 103 101 - 111 mmol/L   CO2 25 22 - 32 mmol/L   Glucose, Bld 218 (H) 65 - 99 mg/dL   BUN 19 6 - 20 mg/dL   Creatinine, Ser 4.09 0.61 - 1.24 mg/dL   Calcium 9.1 8.9 - 81.1 mg/dL   GFR calc non Af Amer >60 >60 mL/min   GFR calc Af Amer >60 >60 mL/min   Anion gap 11 5 - 15    Imaging Review Dg Abd Acute W/chest  04/12/2015  CLINICAL DATA:  Nausea vomiting and left flank pain since last night EXAM: DG ABDOMEN ACUTE W/ 1V CHEST COMPARISON:  None in PACs FINDINGS: Chest x-ray: The lungs are well-expanded and clear. The heart and mediastinal structures are normal. There is no pleural effusion. There is mild degenerative disc disease at multiple thoracic levels. Supine and upright abdominal images: The bowel gas pattern is within the limits of normal. There is a rounded calcific density in the right mid upper abdomen that may lie within the gallbladder. On the left there is an irregular calcification just lateral to the tip of the left L3 transverse process. It measures approximately 3 x 8 mm. No definite soft tissue calcifications are noted elsewhere. There is mild degenerative disc change in the lower lumbar spine with facet joint hypertrophy. IMPRESSION: 1. Probable 3 x 8 mm calcified stone in the proximal left ureter at approximately the L3 level. Possible gallstones. 2. No acute cardiopulmonary abnormality. Electronically Signed   By: David  [place] M.D.    On: 04/12/2015 13:37   Ct Renal Stone Study  04/12/2015  CLINICAL DATA:  Left flank pain and elevated white blood cell count EXAM: CT ABDOMEN AND PELVIS WITHOUT CONTRAST TECHNIQUE: Multidetector CT imaging of the abdomen and pelvis was performed following the standard protocol without oral or intravenous contrast material administration. COMPARISON:  December 21, 2006 FINDINGS: Lower chest: There is bibasilar atelectatic change. Lung bases otherwise are clear. There is extensive coronary artery calcification. Hepatobiliary: No focal liver lesions are identified on this noncontrast enhanced study beyond a tiny granuloma in the right lobe of the liver medially. There is cholelithiasis with laminated gallstones in the gallbladder. Gallbladder wall is not thickened. There is no biliary duct dilatation. Pancreas: No pancreatic mass or inflammatory focus. Spleen: No splenic lesions are identified. Adrenals/Urinary Tract: Adrenals appear unremarkable bilaterally. There is no renal mass on either side. There is mild to moderate hydronephrosis on the left with left-sided perinephric stranding. There is no hydronephrosis on the right. There is no intrarenal calculus in either kidney. There  is no right-sided ureteral calculus. There is a calculus in the left ureter at the level of L4 measuring 5 x 4 mm. No other ureteral calculi are identified. Urinary bladder is midline with wall thickness within normal limits. Stomach/Bowel: There are multiple sigmoid diverticula without diverticulitis. There is no bowel wall or mesenteric thickening. No bowel obstruction. No free air or portal venous air. Vascular/Lymphatic: There are atherosclerotic calcifications in the aorta and right common iliac artery. No abdominal aortic aneurysm. The major mesenteric vessels do not show significant calcification. No vascular lesions are identified on this noncontrast enhanced study. There is no demonstrable adenopathy in the abdomen or pelvis.  Reproductive: Prostate is enlarged, measuring 6.6 x 5.9 x 5.1 cm. There is loss of fat plane between the inferior urinary bladder and the prostate. Seminal vesicles appear unremarkable. There is no pelvic mass or pelvic fluid collection. Other: Appendix appears normal. No abscess or ascites in the abdomen or pelvis. There is moderate fat in the left inguinal ring. There is milder fat in the right inguinal ring. Musculoskeletal: There is degenerative change in the lower thoracic and lumbar regions. There are no blastic or lytic bone lesions. There is no intramuscular or abdominal wall lesions. IMPRESSION: There is a 5 x 4 mm calculus in the proximal left ureter with hydronephrosis on the left. There is perinephric stranding on the left as well. No intrarenal calculi on either side. Enlarged prostate with loss of fat plane between the inferior bladder and prostate. Correlation with PSA advised. Given the findings at the base of the urinary bladder, direct visualization may well be warranted as well. Multiple sigmoid diverticula without diverticulitis. No bowel obstruction. No abscess or ascites. Appendix appears normal. Extensive coronary artery calcification. Cholelithiasis. Electronically Signed   By: Bretta Bang III M.D.   On: 04/12/2015 17:10   I have personally reviewed and evaluated these images and lab results as part of my medical decision-making.   EKG Interpretation None      MDM   Final diagnoses:  Flank pain  Left ureteral stone    CT scan shows a proximal left ureteral stone. Also shows irregularity of the prostate gland. Patient will referred to urology for follow-up for both of these. Patient's left flank pain with some improvement here with pain medication. Patient we discharged home with Naprosyn and hydrocodone and Zofran.  Urinalysis not repeated here because it was done at urgent care. Patient's symptoms just started last evening at around 9:00. No prior history of kidney  stones however many years ago did have similar symptoms and it was suspected that maybe he did pass a kidney stone. Patient was given now toward all at the urgent care and referred in here for additional evaluation. Patient nontoxic no acute distress.  Patient with elevated blood sugar here. Patient will need follow-up of this as well. No known history of diabetes.  Vanetta Mulders, MD 04/12/15 [phone]

## 2015-04-12 NOTE — Patient Instructions (Signed)
Because you received an x-ray today, you will receive an invoice from [facility]. Please contact [facility] at [phone] with questions or concerns regarding your invoice. Our billing staff will not be able to assist you with those questions. °

## 2015-04-12 NOTE — Progress Notes (Addendum)
Patient ID: Maxwell Thomas, male   DOB: 1946-01-29, 70 y.o.   MRN: [id number]    By signing my name below, I, Essence Howell, attest that this documentation has been prepared under the direction and in the presence of Collene Gobble, MD Electronically Signed: Charline Bills, ED Scribe 04/12/2015 at 12:52 PM.  Chief Complaint:  Chief Complaint  Patient presents with  . Flank Pain    Lt side pain x last night  . Emesis   HPI: Maxwell Thomas is a 70 y.o. male who reports to [facility] today complaining of sudden onset of left lower abdominal pain onset last night. Pt describes pain as a constant, 7/10, dull pain since last night. He reports associated nausea, emesis, abdominal distension and chills since last night. He further reports similar abdominal pain a few years ago; normal imaging but pt suspected he passed a kidney stone. Pt had a small BM last night without significant relief. He denies fever, dysuria and any other urinary symptoms. No h/o diverticulitis or surgeries. No recent travel or new medications.   Past Medical History  Diagnosis Date  . Hyperlipidemia   . Hypertension    History reviewed. No pertinent past surgical history. Social History   Social History  . Marital Status: Married    Spouse Name: N/A  . Number of Children: N/A  . Years of Education: N/A   Social History Main Topics  . Smoking status: Never Smoker   . Smokeless tobacco: None  . Alcohol Use: No  . Drug Use: No  . Sexual Activity: Not Asked   Other Topics Concern  . None   Social History Narrative  . None   Family History  Problem Relation Age of Onset  . Alzheimer's disease Mother   . Heart attack Father   . Parkinson's disease Brother    No Known Allergies Prior to Admission medications   Medication Sig Start Date End Date Taking? Authorizing Provider  hydrochlorothiazide (HYDRODIURIL) 25 MG tablet Take 25 mg by mouth daily.   Yes Historical Provider, MD  metoprolol succinate (TOPROL-XL) 50  MG 24 hr tablet Take 50 mg by mouth daily. Take with or immediately following a meal.   Yes Historical Provider, MD  rosuvastatin (CRESTOR) 10 MG tablet Take 10 mg by mouth daily.   Yes Historical Provider, MD   ROS: The patient denies fevers, night sweats, unintentional weight loss, chest pain, palpitations, wheezing, dyspnea on exertion, -dysuria, hematuria, melena, numbness, weakness, or tingling. +chills, +nausea, +vomiting, +abdominal distension, +abdominal pain  All other systems have been reviewed and were otherwise negative with the exception of those mentioned in the HPI and as above.    PHYSICAL EXAM: Filed Vitals:   04/12/15 1203 04/12/15 1210  BP: 184/86 182/88  Pulse: 79   Temp: 97.6 F (36.4 C)   Resp: 18    Body mass index is 26.6 kg/(m^2).  General: Alert, no acute distress. Pt is ill appearing, not toxic. HEENT:  Normocephalic, atraumatic, oropharynx patent. Eye: Nonie Hoyer [facility] Cardiovascular:  Regular rate and rhythm, no rubs murmurs or gallops. No Carotid bruits, radial pulse intact. No pedal edema.  Respiratory: A few bibasilar rales bilaterally. No wheezes or rhonchi. No cyanosis, no use of accessory musculature. Abdominal: No organomegaly, abdomen is soft. Present bowel sounds. Mild tenderness to deep palpation LLQ. No masses.  Musculoskeletal: Gait intact. No edema, tenderness Skin: No rashes. Neurologic: Facial musculature symmetric. Psychiatric: Patient acts appropriately throughout our interaction. Lymphatic: No cervical or submandibular lymphadenopathy  LABS:  Results for orders placed or performed in visit on 04/12/15  POCT urinalysis dipstick  Result Value Ref Range   Color, UA yellow yellow   Clarity, UA clear clear   Glucose, UA =500 (A) negative   Bilirubin, UA negative negative   Ketones, POC UA small (15) (A) negative   Spec Grav, UA 1.020    Blood, UA trace-intact (A) negative   pH, UA 7.0    Protein Ur, POC =100 (A) negative   Urobilinogen,  UA 1.0    Nitrite, UA Negative Negative   Leukocytes, UA Negative Negative  POCT Microscopic Urinalysis ([facility])  Result Value Ref Range   WBC,UR,HPF,POC None None WBC/hpf   RBC,UR,HPF,POC Few (A) None RBC/hpf   Bacteria None None, Too numerous to count   Mucus Absent Absent   Epithelial Cells, UR Per Microscopy Few (A) None, Too numerous to count cells/hpf  POCT CBC  Result Value Ref Range   WBC 12.7 (A) 4.6 - 10.2 K/uL   Lymph, poc 0.9 0.6 - 3.4   POC LYMPH PERCENT 7.0 (A) 10 - 50 %L   MID (cbc) 0.4 0 - 0.9   POC MID % 3.0 0 - 12 %M   POC Granulocyte 11.4 (A) 2 - 6.9   Granulocyte percent 90.0 (A) 37 - 80 %G   RBC 5.46 4.69 - 6.13 M/uL   Hemoglobin 16.9 14.1 - 18.1 g/dL   HCT, POC 16.1 09.6 - 53.7 %   MCV 87.4 80 - 97 fL   MCH, POC 30.9 27 - 31.2 pg   MCHC 35.3 31.8 - 35.4 g/dL   RDW, POC 04.5 %   Platelet Count, POC 256 142 - 424 K/uL   MPV 7.5 0 - 99.8 fL  POCT glucose (manual entry)  Result Value Ref Range   POC Glucose 236 (A) 70 - 99 mg/dl   EKG/XRAY:   Primary read interpreted by Dr. Cleta Alberts at [facility]. 4 mm calcification adjacent to L3 transverse process. Dg Abd Acute W/chest  04/12/2015  CLINICAL DATA:  Nausea vomiting and left flank pain since last night EXAM: DG ABDOMEN ACUTE W/ 1V CHEST COMPARISON:  None in PACs FINDINGS: Chest x-ray: The lungs are well-expanded and clear. The heart and mediastinal structures are normal. There is no pleural effusion. There is mild degenerative disc disease at multiple thoracic levels. Supine and upright abdominal images: The bowel gas pattern is within the limits of normal. There is a rounded calcific density in the right mid upper abdomen that may lie within the gallbladder. On the left there is an irregular calcification just lateral to the tip of the left L3 transverse process. It measures approximately 3 x 8 mm. No definite soft tissue calcifications are noted elsewhere. There is mild degenerative disc change in the lower lumbar spine with  facet joint hypertrophy. IMPRESSION: 1. Probable 3 x 8 mm calcified stone in the proximal left ureter at approximately the L3 level. Possible gallstones. 2. No acute cardiopulmonary abnormality. Electronically Signed   By: David  [place] M.D.   On: 04/12/2015 13:37    ASSESSMENT/PLAN: white count is elevated. He does have what appears to be a stone adjacent to L3 transverse process. I'm very concerned about possible diverticulitis but this could also be renal with stone disease. Radiologist feels he could also possibly have a gallstone. Certainly he needs to have a scanning of his abdomen. Patient will be sent to the ER [facility] for evaluation. I personally performed the services described in this  documentation, which was scribed in my presence. The recorded information has been reviewed and is accurate. He did get some relief while I was out of rhythm and then his pain returned. This may be coming from a right-sided mid ureter kidney stone. He will be given 30 Toradol EMS called for transport.   Gross sideeffects, risk and benefits, and alternatives of medications d/w patient. Patient is aware that all medications have potential sideeffects and we are unable to predict every sideeffect or drug-drug interaction that may occur.  Lesle Chris MD 04/12/2015 12:39 PM

## 2015-04-12 NOTE — Discharge Instructions (Signed)
Workup here shows evidence of a left-sided kidney stone. Also showed the irregularity of the prostate gland. Both of these can be a properly followed up by urology call in the morning to make an appointment. Take the Naprosyn on a regular basis. Supplement with the hydrocodone as needed for additional pain control. Take Zofran as needed for nausea and vomiting. Return here for any new or worse symptoms.

## 2015-05-04 DIAGNOSIS — N210 Calculus in bladder: Secondary | ICD-10-CM | POA: Diagnosis not present

## 2015-05-04 DIAGNOSIS — N201 Calculus of ureter: Secondary | ICD-10-CM | POA: Diagnosis not present

## 2015-05-04 DIAGNOSIS — R110 Nausea: Secondary | ICD-10-CM | POA: Diagnosis not present

## 2015-05-10 ENCOUNTER — Emergency Department (HOSPITAL_COMMUNITY): Payer: Medicare Other

## 2015-05-10 ENCOUNTER — Emergency Department (HOSPITAL_COMMUNITY)
Admission: EM | Admit: 2015-05-10 | Discharge: 2015-05-10 | Disposition: A | Discharge status: Home / Self Care | Payer: Medicare Other | Attending: Emergency Medicine | Admitting: Emergency Medicine

## 2015-05-10 ENCOUNTER — Encounter (HOSPITAL_COMMUNITY): Payer: Self-pay

## 2015-05-10 DIAGNOSIS — R1011 Right upper quadrant pain: Secondary | ICD-10-CM | POA: Diagnosis not present

## 2015-05-10 DIAGNOSIS — R109 Unspecified abdominal pain: Secondary | ICD-10-CM | POA: Diagnosis not present

## 2015-05-10 DIAGNOSIS — K2990 Gastroduodenitis, unspecified, without bleeding: Secondary | ICD-10-CM | POA: Diagnosis not present

## 2015-05-10 DIAGNOSIS — E785 Hyperlipidemia, unspecified: Secondary | ICD-10-CM | POA: Insufficient documentation

## 2015-05-10 DIAGNOSIS — Z79899 Other long term (current) drug therapy: Secondary | ICD-10-CM | POA: Diagnosis not present

## 2015-05-10 DIAGNOSIS — N390 Urinary tract infection, site not specified: Secondary | ICD-10-CM

## 2015-05-10 DIAGNOSIS — R1012 Left upper quadrant pain: Secondary | ICD-10-CM | POA: Diagnosis not present

## 2015-05-10 DIAGNOSIS — I10 Essential (primary) hypertension: Secondary | ICD-10-CM | POA: Insufficient documentation

## 2015-05-10 DIAGNOSIS — R110 Nausea: Secondary | ICD-10-CM

## 2015-05-10 DIAGNOSIS — K2980 Duodenitis without bleeding: Secondary | ICD-10-CM | POA: Diagnosis not present

## 2015-05-10 DIAGNOSIS — K2970 Gastritis, unspecified, without bleeding: Secondary | ICD-10-CM | POA: Diagnosis not present

## 2015-05-10 LAB — COMPREHENSIVE METABOLIC PANEL
ALBUMIN: 4.3 g/dL (ref 3.5–5.0)
ALK PHOS: 60 U/L (ref 38–126)
ALT: 28 U/L (ref 17–63)
ANION GAP: 12 (ref 5–15)
AST: 20 U/L (ref 15–41)
BUN: 15 mg/dL (ref 6–20)
CALCIUM: 9.3 mg/dL (ref 8.9–10.3)
CHLORIDE: 99 mmol/L — AB (ref 101–111)
CO2: 26 mmol/L (ref 22–32)
Creatinine, Ser: 0.82 mg/dL (ref 0.61–1.24)
GFR calc Af Amer: >60 mL/min (ref >60)
GFR calc non Af Amer: >60 mL/min (ref >60)
GLUCOSE: 133 mg/dL — AB (ref 65–99)
Potassium: 4.2 mmol/L (ref 3.5–5.1)
SODIUM: 137 mmol/L (ref 135–145)
Total Bilirubin: 1.1 mg/dL (ref 0.3–1.2)
Total Protein: 7.8 g/dL (ref 6.5–8.1)

## 2015-05-10 LAB — URINALYSIS, ROUTINE W REFLEX MICROSCOPIC
Glucose, UA: NEGATIVE mg/dL
HGB URINE DIPSTICK: NEGATIVE
Ketones, ur: NEGATIVE mg/dL
NITRITE: NEGATIVE
PROTEIN: NEGATIVE mg/dL
SPECIFIC GRAVITY, URINE: 1.034 — AB (ref 1.005–1.030)
pH: 5.5 (ref 5.0–8.0)

## 2015-05-10 LAB — CBC
HEMATOCRIT: 42.4 % (ref 39.0–52.0)
HEMOGLOBIN: 14.3 g/dL (ref 13.0–17.0)
MCH: 30.3 pg (ref 26.0–34.0)
MCHC: 33.7 g/dL (ref 30.0–36.0)
MCV: 89.8 fL (ref 78.0–100.0)
Platelets: 349 10*3/uL (ref 150–400)
RBC: 4.72 MIL/uL (ref 4.22–5.81)
RDW: 14.1 % (ref 11.5–15.5)
WBC: 10.7 10*3/uL — ABNORMAL HIGH (ref 4.0–10.5)

## 2015-05-10 LAB — URINE MICROSCOPIC-ADD ON
RBC / HPF: NONE SEEN RBC/hpf (ref 0–5)
SQUAMOUS EPITHELIAL / LPF: NONE SEEN

## 2015-05-10 LAB — LIPASE, BLOOD: LIPASE: 31 U/L (ref 11–51)

## 2015-05-10 IMAGING — CT CT RENAL STONE PROTOCOL
2 of 3 series · 15 of 42 positions shown, 17 images · non-contrast
Comparison: [DATE]

CLINICAL DATA: Bladder stone 4 weeks ago. Off and on abdominal pain
since that time.

EXAM:
CT ABDOMEN AND PELVIS WITHOUT CONTRAST
TECHNIQUE: Multidetector CT imaging of the abdomen and pelvis was performed
following the standard protocol without IV contrast.

[Series 4: lung · axial · 0.76mm/px · z∈[+1285,+1400]mm · 12 of 28 slices shown, 14 images]
[im 3/28  soft-tissue]
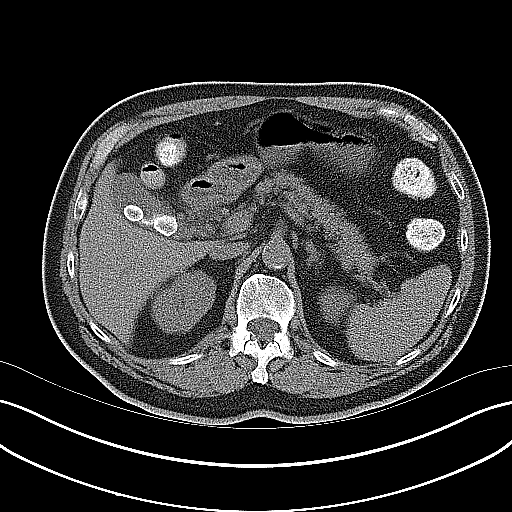
[im 3/28  bone]
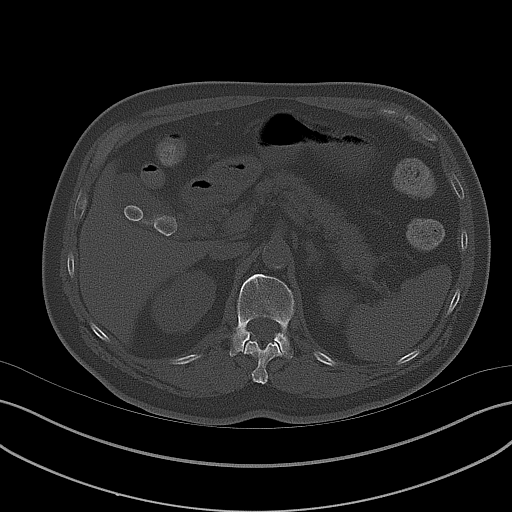
[im 5/28  soft-tissue]
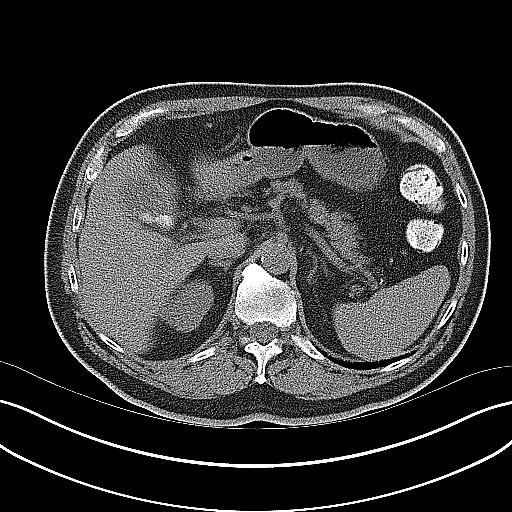
[im 7/28  soft-tissue]
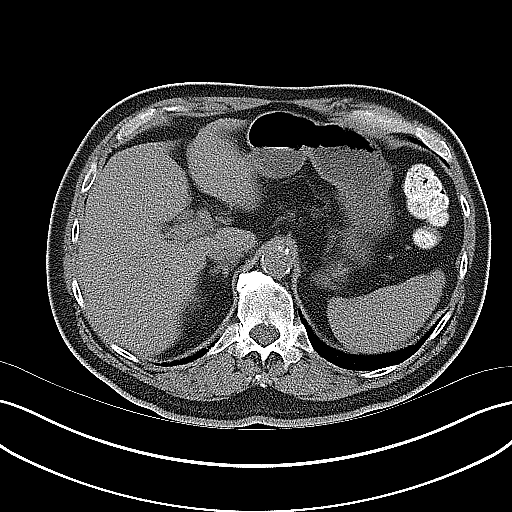
[im 9/28  soft-tissue]
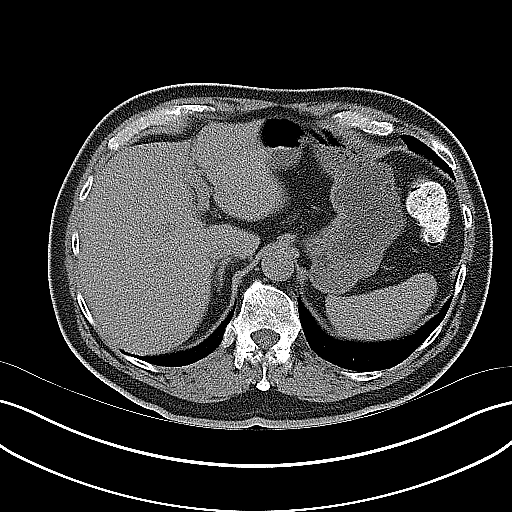
[im 11/28  soft-tissue]
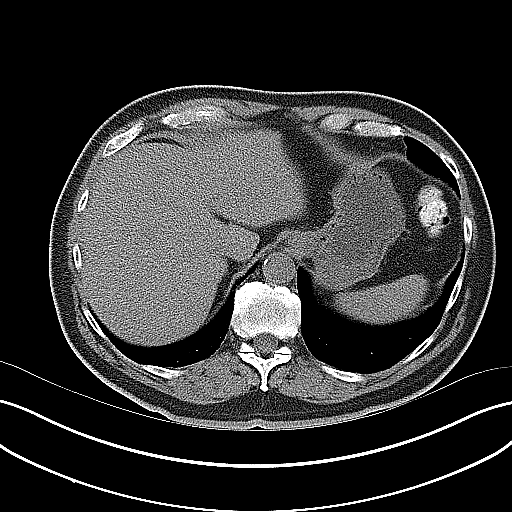
[im 13/28  soft-tissue]
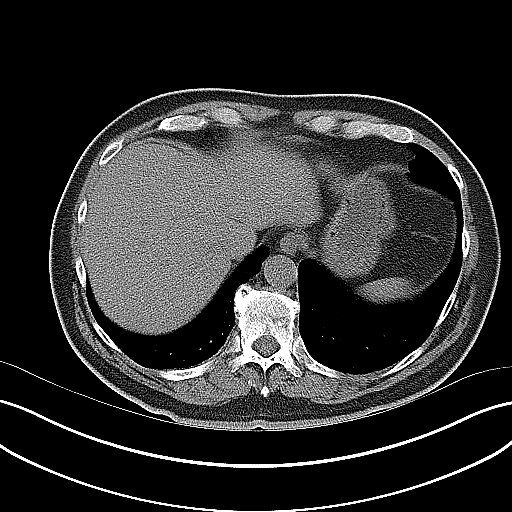
[im 16/28  soft-tissue]
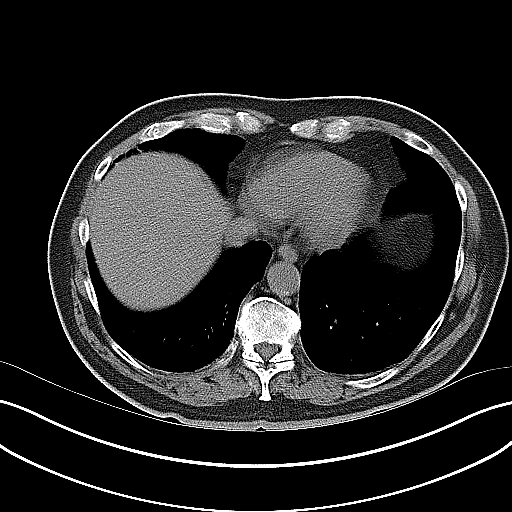
[im 18/28  soft-tissue]
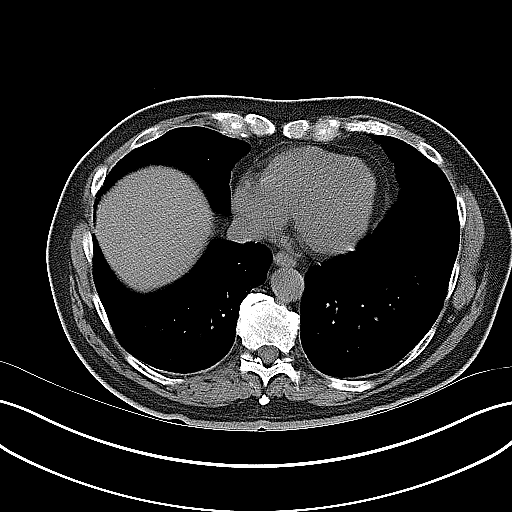
[im 20/28  soft-tissue]
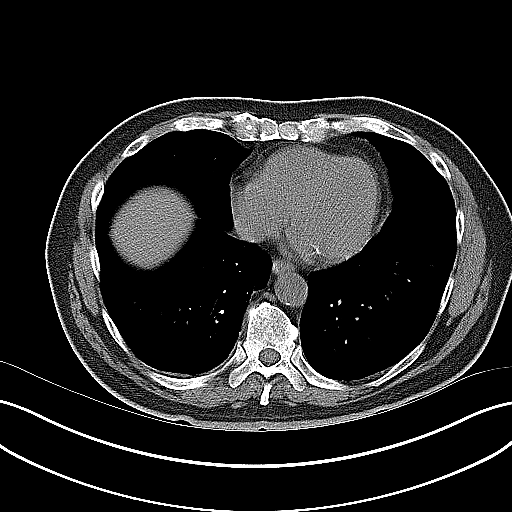
[im 20/28  bone]
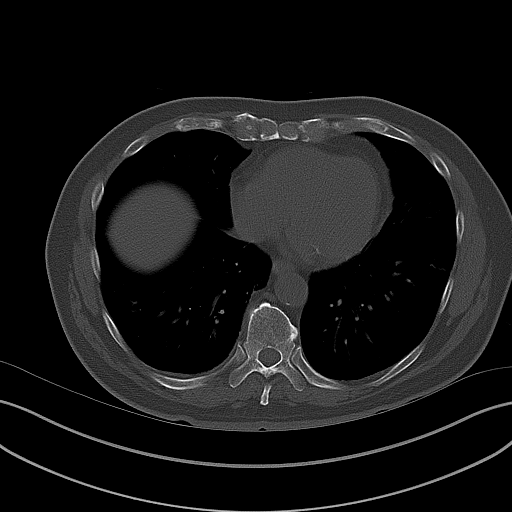
[im 22/28  soft-tissue]
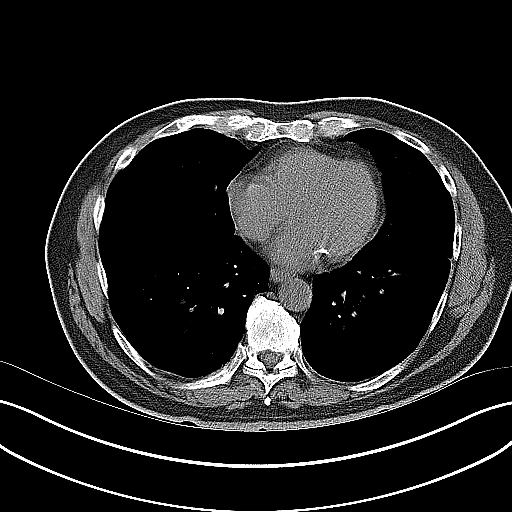
[im 24/28  soft-tissue]
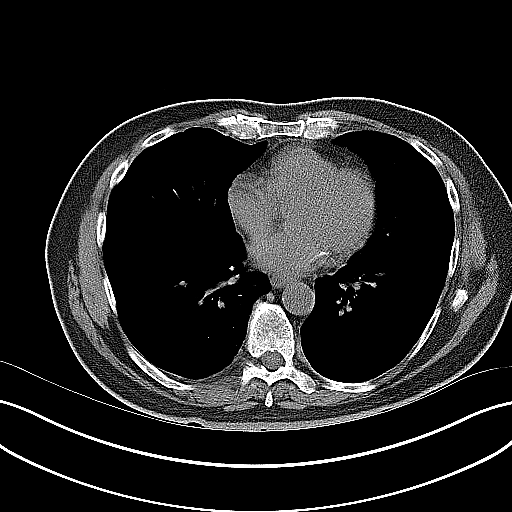
[im 26/28  soft-tissue]
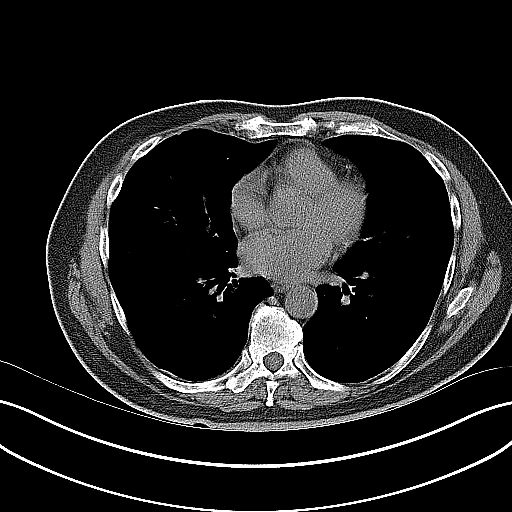

[Series 5: coronal · coronal · 0.74mm/px · 3 of 88 slices shown]
[im 30/88  soft-tissue]
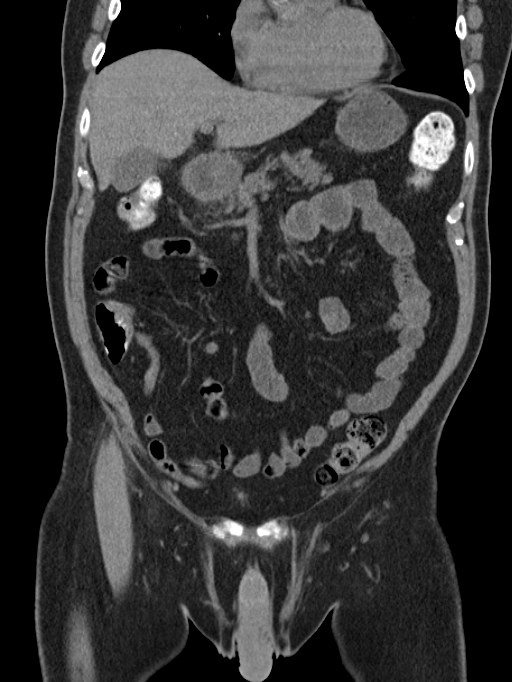
[im 39/88  soft-tissue]
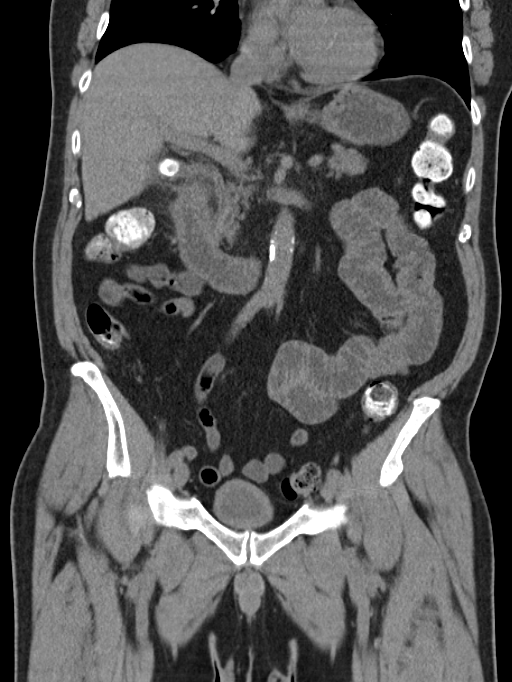
[im 49/88  soft-tissue]
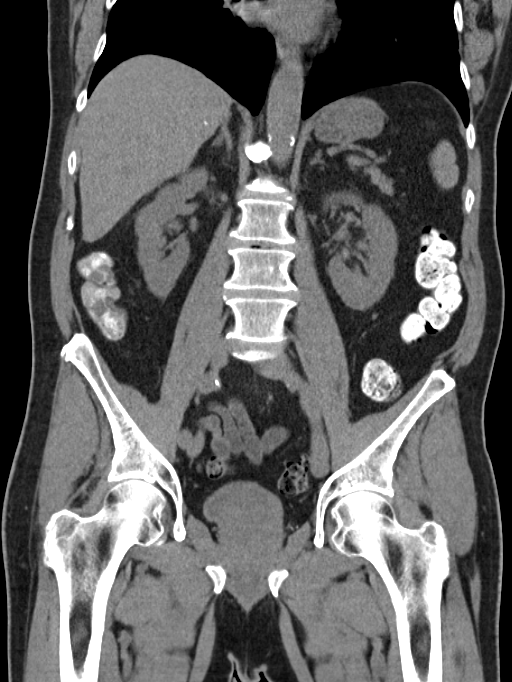

[15 of 42 positions shown; findings below may reference images not displayed]

FINDINGS: Lower chest:  Unremarkable.

Hepatobiliary: No focal abnormality in the liver on this study
without intravenous contrast. No evidence of hepatomegaly. Calcified
gallstones measure up to 2 cm. No intrahepatic or extrahepatic
biliary dilation.

Pancreas: No focal mass lesion. No dilatation of the main duct. No
intraparenchymal cyst. No peripancreatic edema.

Spleen: No splenomegaly. No focal mass lesion.

Adrenals/Urinary Tract: No adrenal nodule or mass. Kidneys have
normal uninfused appearance. Specifically, no evidence for renal
stones or secondary change in either kidney. No evidence for
hydroureter. The proximal left ureteral stones seen previously is no
longer evident, but on today's exam, a 5 x 6 x 7 mm stone is
identified in the right bladder. This is in the region of the UVJ,
but there is no right-sided renal or ureteral stone previously and
mass-effect from the enlarged prostate gland distorts the posterior
bladder wall which could account for this stone being off midline.

Stomach/Bowel: Stomach is nondistended. No gastric wall thickening.
No evidence of outlet obstruction. Duodenal wall appears thickened
and irregular and there is edema/ inflammation circumferentially
around the second portion of the duodenum. The duodenum does appear
to be the epicenter of these changes and not the gallbladder or
pancreatic head. No small bowel wall thickening. No small bowel
dilatation. The terminal ileum is normal. The appendix is normal. No
gross colonic mass. No colonic wall thickening. No substantial
diverticular change.

Vascular/Lymphatic: There is abdominal aortic atherosclerosis
without aneurysm. There is no gastrohepatic or hepatoduodenal
ligament lymphadenopathy. No intraperitoneal or retroperitoneal
lymphadenopathy. No pelvic sidewall lymphadenopathy.

Reproductive: Prostate gland is markedly enlarged.

Other: No intraperitoneal free fluid.

Musculoskeletal: Small left inguinal hernia contains only fat. Bone
windows reveal no worrisome lytic or sclerotic osseous lesions.
IMPRESSION: 1. Interval development of periduodenal edema/inflammation. Given
the ill definition of the duodenal wall , primary duodenal etiology
is suspected such as peptic ulcer disease or duodenitis. There are
gallstones in the gallbladder, but pericholecystic fat appears
relatively well preserved in by CT imaging, acute cholecystitis
would be considered less likely. Similarly, the pancreas does not
appear to be be at the center of these changes and acute
pancreatitis is considered less likely.
2. Left ureteral stone seen previously is now positioned in the
bladder lumen.
3. Abdominal aortic atherosclerosis.
4. Small left inguinal hernia contains only fat.
5. Prostatomegaly.

## 2015-05-10 MED ORDER — ONDANSETRON 4 MG PO TBDP: 4.0000 mg | ORAL_TABLET | Freq: Three times a day (TID) | ORAL | Status: DC | PRN

## 2015-05-10 MED ORDER — PANTOPRAZOLE SODIUM 40 MG PO TBEC: 40.0000 mg | DELAYED_RELEASE_TABLET | Freq: Every day | ORAL | Status: DC

## 2015-05-10 MED ORDER — CEPHALEXIN 500 MG PO CAPS: 500.0000 mg | ORAL_CAPSULE | Freq: Two times a day (BID) | ORAL | Status: DC

## 2015-05-10 MED ORDER — PANTOPRAZOLE SODIUM 40 MG PO TBEC
40.0000 mg | DELAYED_RELEASE_TABLET | Freq: Once | ORAL | Status: AC
Start: 2015-05-10 — End: 2015-05-10
  Administered 2015-05-10: 40 mg via ORAL
  Filled 2015-05-10: qty 1

## 2015-05-10 MED ORDER — CEPHALEXIN 500 MG PO CAPS
500.0000 mg | ORAL_CAPSULE | Freq: Once | ORAL | Status: AC
  Administered 2015-05-10: 500 mg via ORAL
  Filled 2015-05-10: qty 1

## 2015-05-10 NOTE — ED Provider Notes (Signed)
CSN: [id number]     Arrival date & time 05/10/15  1514 History   First MD Initiated Contact with Patient 05/10/15 1749     Chief Complaint  Patient presents with  . Abdominal Pain     The history is provided by the patient. No language interpreter was used.   Maxwell Thomas is a 70 y.o. male who presents to the Emergency Department complaining of nausea.  He was seen in the emergency department 3 weeks ago for a kidney scan. Since that time he's had persistent nausea, decreased appetite, weight loss. He has not had any vomiting, fevers, diarrhea, constipation, chest pain, shortness of breath. He saw his urologist a week ago and was told this stone had passed into his bladder. He presents today to ongoing malaise, nausea, generalized fatigue. He is taking Zofran at home, which gives him temporary relief.  Past Medical History  Diagnosis Date  . Hyperlipidemia   . Hypertension    History reviewed. No pertinent past surgical history. Family History  Problem Relation Age of Onset  . Alzheimer's disease Mother   . Heart attack Father   . Parkinson's disease Brother    Social History  Substance Use Topics  . Smoking status: Never Smoker   . Smokeless tobacco: None  . Alcohol Use: No    Review of Systems  All other systems reviewed and are negative.     Allergies  Review of patient's allergies indicates no known allergies.  Home Medications   Prior to Admission medications   Medication Sig Start Date End Date Taking? Authorizing Provider  hydrochlorothiazide (HYDRODIURIL) 25 MG tablet Take 25 mg by mouth daily.   Yes Historical Provider, MD  metoprolol succinate (TOPROL-XL) 50 MG 24 hr tablet Take 50 mg by mouth daily. Take with or immediately following a meal.   Yes Historical Provider, MD  cephALEXin (KEFLEX) 500 MG capsule Take 1 capsule (500 mg total) by mouth 2 (two) times daily. 05/10/15   Tilden Fossa, MD  ondansetron (ZOFRAN ODT) 4 MG disintegrating tablet Take 1  tablet (4 mg total) by mouth every 8 (eight) hours as needed for nausea or vomiting. 05/10/15   Tilden Fossa, MD  pantoprazole (PROTONIX) 40 MG tablet Take 1 tablet (40 mg total) by mouth daily. 05/10/15   Tilden Fossa, MD  rosuvastatin (CRESTOR) 10 MG tablet Take 10 mg by mouth daily.    Historical Provider, MD   BP 179/93 mmHg  Pulse 86  Temp(Src) 99.1 F (37.3 C) (Oral)  Resp 18  SpO2 96% Physical Exam  Constitutional: He is oriented to person, place, and time. He appears well-developed and well-nourished.  HENT:  Head: Normocephalic and atraumatic.  Cardiovascular: Normal rate and regular rhythm.   No murmur heard. Pulmonary/Chest: Effort normal and breath sounds normal. No respiratory distress.  Abdominal: Soft. There is no tenderness. There is no rebound and no guarding.  Musculoskeletal: He exhibits no edema or tenderness.  Neurological: He is alert and oriented to person, place, and time.  Skin: Skin is warm and dry.  Psychiatric: He has a normal mood and affect. His behavior is normal.  Nursing note and vitals reviewed.   ED Course  Procedures (including critical care time) Labs Review Labs Reviewed  COMPREHENSIVE METABOLIC PANEL - Abnormal; Notable for the following:    Chloride 99 (*)    Glucose, Bld 133 (*)    All other components within normal limits  CBC - Abnormal; Notable for the following:    WBC 10.7 (*)  All other components within normal limits  URINALYSIS, ROUTINE W REFLEX MICROSCOPIC (NOT AT [facility]) - Abnormal; Notable for the following:    Color, Urine ORANGE (*)    APPearance CLOUDY (*)    Specific Gravity, Urine 1.034 (*)    Bilirubin Urine MODERATE (*)    Leukocytes, UA TRACE (*)    All other components within normal limits  URINE MICROSCOPIC-ADD ON - Abnormal; Notable for the following:    Bacteria, UA MANY (*)    Casts HYALINE CASTS (*)    All other components within normal limits  URINE CULTURE  LIPASE, BLOOD    Imaging Review Ct  Renal Stone Study  05/10/2015  CLINICAL DATA:  Bladder stone 4 weeks ago. Off and on abdominal pain since that time. EXAM: CT ABDOMEN AND PELVIS WITHOUT CONTRAST TECHNIQUE: Multidetector CT imaging of the abdomen and pelvis was performed following the standard protocol without IV contrast. COMPARISON:  04/12/2015 FINDINGS: Lower chest:  Unremarkable. Hepatobiliary: No focal abnormality in the liver on this study without intravenous contrast. No evidence of hepatomegaly. Calcified gallstones measure up to 2 cm. No intrahepatic or extrahepatic biliary dilation. Pancreas: No focal mass lesion. No dilatation of the main duct. No intraparenchymal cyst. No peripancreatic edema. Spleen: No splenomegaly. No focal mass lesion. Adrenals/Urinary Tract: No adrenal nodule or mass. Kidneys have normal uninfused appearance. Specifically, no evidence for renal stones or secondary change in either kidney. No evidence for hydroureter. The proximal left ureteral stones seen previously is no longer evident, but on today's exam, a 5 x 6 x 7 mm stone is identified in the right bladder. This is in the region of the UVJ, but there is no right-sided renal or ureteral stone previously and mass-effect from the enlarged prostate gland distorts the posterior bladder wall which could account for this stone being off midline. Stomach/Bowel: Stomach is nondistended. No gastric wall thickening. No evidence of outlet obstruction. Duodenal wall appears thickened and irregular and there is edema/ inflammation circumferentially around the second portion of the duodenum. The duodenum does appear to be the epicenter of these changes and not the gallbladder or pancreatic head. No small bowel wall thickening. No small bowel dilatation. The terminal ileum is normal. The appendix is normal. No gross colonic mass. No colonic wall thickening. No substantial diverticular change. Vascular/Lymphatic: There is abdominal aortic atherosclerosis without aneurysm.  There is no gastrohepatic or hepatoduodenal ligament lymphadenopathy. No intraperitoneal or retroperitoneal lymphadenopathy. No pelvic sidewall lymphadenopathy. Reproductive: Prostate gland is markedly enlarged. Other: No intraperitoneal free fluid. Musculoskeletal: Small left inguinal hernia contains only fat. Bone windows reveal no worrisome lytic or sclerotic osseous lesions. IMPRESSION: 1. Interval development of periduodenal edema/inflammation. Given the ill definition of the duodenal wall , primary duodenal etiology is suspected such as peptic ulcer disease or duodenitis. There are gallstones in the gallbladder, but pericholecystic fat appears relatively well preserved in by CT imaging, acute cholecystitis would be considered less likely. Similarly, the pancreas does not appear to be be at the center of these changes and acute pancreatitis is considered less likely. 2. Left ureteral stone seen previously is now positioned in the bladder lumen. 3. Abdominal aortic atherosclerosis. 4. Small left inguinal hernia contains only fat. 5. Prostatomegaly. Electronically Signed   By: Kennith Center M.D.   On: 05/10/2015 20:07   I have personally reviewed and evaluated these images and lab results as part of my medical decision-making.   EKG Interpretation None      MDM   Final diagnoses:  Nausea  Acute UTI  Gastritis and duodenitis    Patient here with 3 weeks of progressive nausea and decreased appetite. He has no pain.  CT scan with possible duodenitis. History, examination, imaging is not consistent with acute cholecystitis. He is tolerating orals in the emergency department. Treating for possible peptic ulcer disease. UA is concerning for possible urinary tract infection, will start on Keflex and sent cultures. Discussed with patient outpatient follow-up, home care, return options.  Tilden Fossa, MD 05/11/15 [phone]

## 2015-05-10 NOTE — Discharge Instructions (Signed)
Your CT scan showed inflammation in your stomach. This will need to be followed up by your family doctor and gastroenterologist. Eat a bland diet. Drink plenty of fluids to stay hydrated. You have a possible urinary tract infection.    Duodenitis Duodenitis is inflammation of the lining of the first part of your small intestine (duodenum). There are two types of duodenitis:  Acute duodenitis (develops suddenly and is short lived).   Chronic duodenitis (develops over an extended period and lasts months to years). CAUSES  Duodenitis is most often caused by infection with the bacterium Helicobacter pylori (H. pylori). H. pylori increases the production of stomach acid and causes changes in the environment of the duodenum. This irritates and damages the cells of the duodenum causing inflammation. Other causes of duodenitis include:   Long-term use of nonsteroidal anti-inflammatory drugs (NSAIDs). NSAIDs change the lining of the duodenum and make it more prone to injury from stomach acid.  Excessive use of alcohol. Alcohol increases stomach acid and changes the lining of the duodenum which makes it more likely for inflammation to develop.  Giardiasis. Giardiasis is a common infection of the small intestine. It can cause inflammation of the duodenum.   Other gastrointestinal disorders, such as Crohn disease. People with these disorders are more likely to develop duodenitis. SYMPTOMS  Although duodenitis does not always cause symptoms, symptoms that do occur include:  Nausea or vomiting.  Gassy, bloated feeling or an uncomfortable feeling of fullness after eating.  Burning, cramps, or pain in the upper abdominal area. DIAGNOSIS  To diagnose duodenitis, your health care provider may use results from:   An exam of the duodenum using a thin tube with a tiny camera on the tip, which is placed down your throat (endoscope). The endoscope is passed through your stomach and into your duodenum.  Sometimes a sample of tissue from your duodenum is removed with the endoscope. The sample is then examined under a microscope (biopsy) for signs of inflammation and H. pylori infection.   Tests that check samples of your blood or stool for H. pylori infection.   A test that checks the gases in a sample of your expired breath for H. pylori infection. The test measures the levels of carbon dioxide in your breath after you drink a special solution.  An X-ray exam using a special liquid that you swallow to illuminate your digestive tract (barium) to show signs of inflammation. TREATMENT  Treatment will depend on the cause of the duodenitis. The most common treatments include:  Use of medication to treat infection.  Medication to reduce stomach acid.  Discontinuing the use of NSAIDs.  Management of other gastrointestinal conditions.  Avoiding alcohol consumption. Additionally, taking the following steps can help to reduce the severity of your symptoms:  Drink enough water to keep your urine clear or pale yellow.  Avoid consuming these foods or drinks:  Caffeinated drinks.  Chocolate.  Peppermint or mint-flavored food or drinks.  Garlic.  Onions.  Spicy foods.  Citrus fruits, such as oranges, lemons, or limes.  Foods that use tomato-based sauces, such as pasta sauce, chili, salsa, and pizza.  Fatty foods.  Fried foods.   This information is not intended to replace advice given to you by your health care provider. Make sure you discuss any questions you have with your health care provider.   Document Released: 06/29/2012 Document Revised: 03/25/2014 Document Reviewed: 06/29/2012 Elsevier Interactive Patient Education 2016 [employer]  Nausea, Adult Nausea is the feeling that  you have an upset stomach or have to vomit. Nausea by itself is not likely a serious concern, but it may be an early sign of more serious medical problems. As nausea gets worse, it can lead to  vomiting. If vomiting develops, there is the risk of dehydration.  CAUSES   Viral infections.  Food poisoning.  Medicines.  Pregnancy.  Motion sickness.  Migraine headaches.  Emotional distress.  Severe pain from any source.  Alcohol intoxication. HOME CARE INSTRUCTIONS  Get plenty of rest.  Ask your caregiver about specific rehydration instructions.  Eat small amounts of food and sip liquids more often.  Take all medicines as told by your caregiver. SEEK MEDICAL CARE IF:  You have not improved after 2 days, or you get worse.  You have a headache. SEEK IMMEDIATE MEDICAL CARE IF:   You have a fever.  You faint.  You keep vomiting or have blood in your vomit.  You are extremely weak or dehydrated.  You have dark or bloody stools.  You have severe chest or abdominal pain. MAKE SURE YOU:  Understand these instructions.  Will watch your condition.  Will get help right away if you are not doing well or get worse.   This information is not intended to replace advice given to you by your health care provider. Make sure you discuss any questions you have with your health care provider.   Document Released: 04/11/2004 Document Revised: 03/25/2014 Document Reviewed: 11/14/2010 Elsevier Interactive Patient Education [employer]

## 2015-05-10 NOTE — ED Notes (Signed)
Pt cannot use restroom at this time, aware urine specimen is needed.  

## 2015-05-10 NOTE — ED Notes (Signed)
Pt dx with kidney stone in his bladder 4 weeks ago.  Since then he has had trouble sleeping and on/off abdominal pain.  Pt went to [facility] today and told to come here.  Urology had given zofran and it would help a little.

## 2015-05-12 LAB — URINE CULTURE

## 2015-05-22 DIAGNOSIS — E119 Type 2 diabetes mellitus without complications: Secondary | ICD-10-CM | POA: Diagnosis not present

## 2015-05-22 DIAGNOSIS — N390 Urinary tract infection, site not specified: Secondary | ICD-10-CM | POA: Diagnosis not present

## 2015-05-22 DIAGNOSIS — R935 Abnormal findings on diagnostic imaging of other abdominal regions, including retroperitoneum: Secondary | ICD-10-CM | POA: Diagnosis not present

## 2015-05-22 DIAGNOSIS — Z86010 Personal history of colonic polyps: Secondary | ICD-10-CM | POA: Diagnosis not present

## 2015-05-22 DIAGNOSIS — R1011 Right upper quadrant pain: Secondary | ICD-10-CM | POA: Diagnosis not present

## 2015-05-22 DIAGNOSIS — K8020 Calculus of gallbladder without cholecystitis without obstruction: Secondary | ICD-10-CM | POA: Diagnosis not present

## 2015-06-06 DIAGNOSIS — N210 Calculus in bladder: Secondary | ICD-10-CM | POA: Diagnosis not present

## 2015-06-15 DIAGNOSIS — R1011 Right upper quadrant pain: Secondary | ICD-10-CM | POA: Diagnosis not present

## 2015-06-15 DIAGNOSIS — K3189 Other diseases of stomach and duodenum: Secondary | ICD-10-CM | POA: Diagnosis not present

## 2015-06-15 DIAGNOSIS — R933 Abnormal findings on diagnostic imaging of other parts of digestive tract: Secondary | ICD-10-CM | POA: Diagnosis not present

## 2015-06-15 DIAGNOSIS — K2980 Duodenitis without bleeding: Secondary | ICD-10-CM | POA: Diagnosis not present

## 2015-08-15 DIAGNOSIS — M79671 Pain in right foot: Secondary | ICD-10-CM | POA: Diagnosis not present

## 2015-08-15 DIAGNOSIS — K2980 Duodenitis without bleeding: Secondary | ICD-10-CM | POA: Diagnosis not present

## 2015-08-15 DIAGNOSIS — I10 Essential (primary) hypertension: Secondary | ICD-10-CM | POA: Diagnosis not present

## 2015-08-15 DIAGNOSIS — E119 Type 2 diabetes mellitus without complications: Secondary | ICD-10-CM | POA: Diagnosis not present

## 2015-08-15 DIAGNOSIS — E782 Mixed hyperlipidemia: Secondary | ICD-10-CM | POA: Diagnosis not present

## 2016-02-29 DIAGNOSIS — E782 Mixed hyperlipidemia: Secondary | ICD-10-CM | POA: Diagnosis not present

## 2016-02-29 DIAGNOSIS — Z23 Encounter for immunization: Secondary | ICD-10-CM | POA: Diagnosis not present

## 2016-02-29 DIAGNOSIS — E119 Type 2 diabetes mellitus without complications: Secondary | ICD-10-CM | POA: Diagnosis not present

## 2016-02-29 DIAGNOSIS — L570 Actinic keratosis: Secondary | ICD-10-CM | POA: Diagnosis not present

## 2016-02-29 DIAGNOSIS — I10 Essential (primary) hypertension: Secondary | ICD-10-CM | POA: Diagnosis not present

## 2016-07-04 DIAGNOSIS — E782 Mixed hyperlipidemia: Secondary | ICD-10-CM | POA: Diagnosis not present

## 2016-07-04 DIAGNOSIS — S30860A Insect bite (nonvenomous) of lower back and pelvis, initial encounter: Secondary | ICD-10-CM | POA: Diagnosis not present

## 2016-07-04 DIAGNOSIS — W57XXXA Bitten or stung by nonvenomous insect and other nonvenomous arthropods, initial encounter: Secondary | ICD-10-CM | POA: Diagnosis not present

## 2016-09-10 DIAGNOSIS — I10 Essential (primary) hypertension: Secondary | ICD-10-CM | POA: Diagnosis not present

## 2016-09-10 DIAGNOSIS — E1165 Type 2 diabetes mellitus with hyperglycemia: Secondary | ICD-10-CM | POA: Diagnosis not present

## 2016-09-10 DIAGNOSIS — E119 Type 2 diabetes mellitus without complications: Secondary | ICD-10-CM | POA: Diagnosis not present

## 2016-09-10 DIAGNOSIS — L570 Actinic keratosis: Secondary | ICD-10-CM | POA: Diagnosis not present

## 2016-09-10 DIAGNOSIS — E782 Mixed hyperlipidemia: Secondary | ICD-10-CM | POA: Diagnosis not present

## 2017-03-31 DIAGNOSIS — E119 Type 2 diabetes mellitus without complications: Secondary | ICD-10-CM | POA: Diagnosis not present

## 2017-03-31 DIAGNOSIS — L570 Actinic keratosis: Secondary | ICD-10-CM | POA: Diagnosis not present

## 2017-03-31 DIAGNOSIS — I10 Essential (primary) hypertension: Secondary | ICD-10-CM | POA: Diagnosis not present

## 2017-03-31 DIAGNOSIS — E782 Mixed hyperlipidemia: Secondary | ICD-10-CM | POA: Diagnosis not present

## 2017-06-19 ENCOUNTER — Encounter (HOSPITAL_COMMUNITY): Payer: Self-pay

## 2017-06-19 ENCOUNTER — Emergency Department (HOSPITAL_COMMUNITY): Payer: Medicare Other

## 2017-06-19 ENCOUNTER — Emergency Department (HOSPITAL_COMMUNITY)
Admission: EM | Admit: 2017-06-19 | Discharge: 2017-06-19 | Disposition: A | Discharge status: Home / Self Care | Payer: Medicare Other | Attending: Emergency Medicine | Admitting: Emergency Medicine

## 2017-06-19 ENCOUNTER — Other Ambulatory Visit: Payer: Self-pay

## 2017-06-19 DIAGNOSIS — Y999 Unspecified external cause status: Secondary | ICD-10-CM | POA: Diagnosis not present

## 2017-06-19 DIAGNOSIS — S67191A Crushing injury of left index finger, initial encounter: Secondary | ICD-10-CM | POA: Diagnosis not present

## 2017-06-19 DIAGNOSIS — S6010XA Contusion of unspecified finger with damage to nail, initial encounter: Secondary | ICD-10-CM

## 2017-06-19 DIAGNOSIS — Y939 Activity, unspecified: Secondary | ICD-10-CM | POA: Diagnosis not present

## 2017-06-19 DIAGNOSIS — W230XXA Caught, crushed, jammed, or pinched between moving objects, initial encounter: Secondary | ICD-10-CM | POA: Insufficient documentation

## 2017-06-19 DIAGNOSIS — S61311A Laceration without foreign body of left index finger with damage to nail, initial encounter: Secondary | ICD-10-CM | POA: Insufficient documentation

## 2017-06-19 DIAGNOSIS — Z23 Encounter for immunization: Secondary | ICD-10-CM | POA: Diagnosis not present

## 2017-06-19 DIAGNOSIS — Y929 Unspecified place or not applicable: Secondary | ICD-10-CM | POA: Diagnosis not present

## 2017-06-19 DIAGNOSIS — S60122A Contusion of left index finger with damage to nail, initial encounter: Secondary | ICD-10-CM | POA: Insufficient documentation

## 2017-06-19 IMAGING — CR DG FINGER INDEX 2+V*L*
3 series · 3 of 3 positions shown · non-contrast
Comparison: None.

CLINICAL DATA: 71 y/o M; finger cough in FRANSESKA
injury.

EXAM:
LEFT INDEX FINGER 2+V

[x finger pa left]
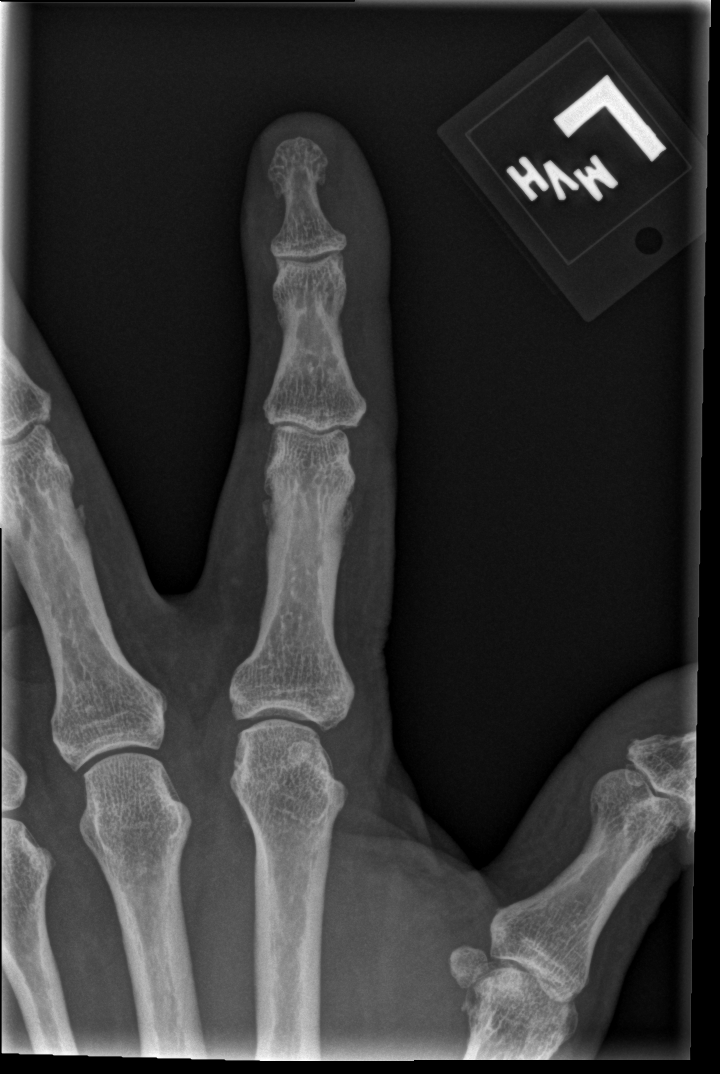

[x finger obl left]
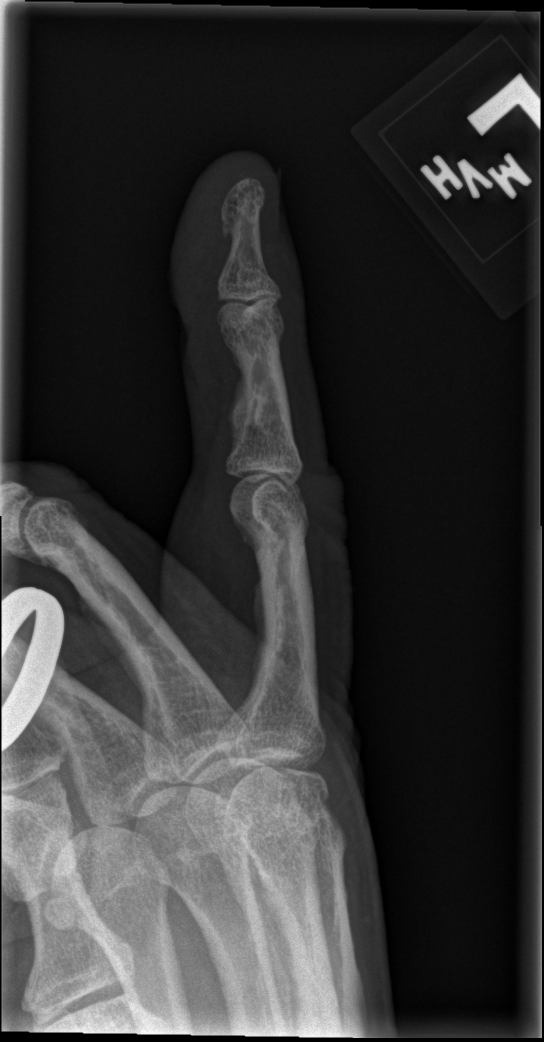

[x finger lat left]
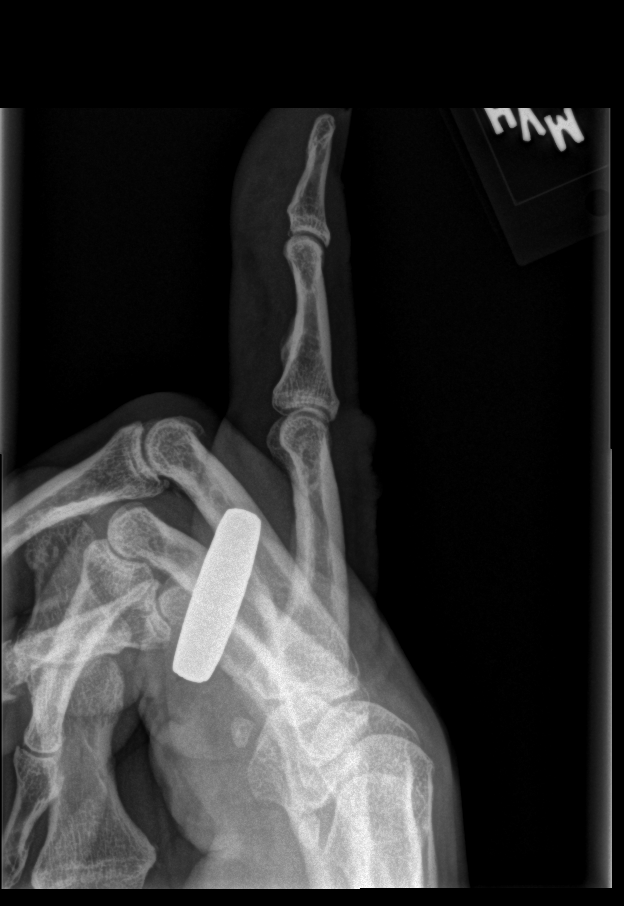

[3 of 3 positions shown; findings below may reference images not displayed]

FINDINGS: There is no evidence of fracture or dislocation. There is no
evidence of arthropathy or other focal bone abnormality.
IMPRESSION: No acute fracture or dislocation identified.

By: FRANSESKA M.D.

## 2017-06-19 MED ORDER — TETANUS-DIPHTH-ACELL PERTUSSIS 5-2.5-18.5 LF-MCG/0.5 IM SUSP
0.5000 mL | Freq: Once | INTRAMUSCULAR | Status: AC
  Administered 2017-06-19: 0.5 mL via INTRAMUSCULAR
  Filled 2017-06-19: qty 0.5

## 2017-06-19 MED ORDER — LIDOCAINE HCL (PF) 1 % IJ SOLN
5.0000 mL | Freq: Once | INTRAMUSCULAR | Status: AC
  Administered 2017-06-19: 5 mL
  Filled 2017-06-19: qty 30

## 2017-06-19 MED ORDER — BUPIVACAINE HCL 0.25 % IJ SOLN: 5.0000 mL | Freq: Once | INTRAMUSCULAR | Status: DC

## 2017-06-19 MED ORDER — TRAMADOL HCL 50 MG PO TABS: 50.0000 mg | ORAL_TABLET | Freq: Four times a day (QID) | ORAL | 0 refills | Status: DC | PRN

## 2017-06-19 MED ORDER — BUPIVACAINE HCL (PF) 0.25 % IJ SOLN
5.0000 mL | Freq: Once | INTRAMUSCULAR | Status: AC
  Administered 2017-06-19: 5 mL
  Filled 2017-06-19: qty 30

## 2017-06-19 MED ORDER — BACITRACIN ZINC 500 UNIT/GM EX OINT
TOPICAL_OINTMENT | Freq: Two times a day (BID) | CUTANEOUS | Status: DC
  Administered 2017-06-19: 1 via TOPICAL
  Filled 2017-06-19: qty 0.9

## 2017-06-19 NOTE — Discharge Instructions (Signed)
You will need to follow up with your doctor, urgent care or return here in one week for suture removal. Return sooner for any signs of infection.

## 2017-06-19 NOTE — ED Triage Notes (Signed)
Pt reports that he was using a wood splitter and accidentally sliced his L index finger. Bleeding controlled in traige. Hx of HTN and is medicated for it.

## 2017-06-19 NOTE — ED Provider Notes (Signed)
[facility]-EMERGENCY DEPT Provider Note   CSN: [id number] Arrival date & time: 06/19/17  1939     History   Chief Complaint Chief Complaint  Patient presents with  . Laceration    L index    HPI Maxwell Thomas is a 72 y.o. male who presents to the ED with injury to the left index finger. Patient reports that he was using a wood splinter and got his finger caught in it causing a laceration and crushing injury to the finger. Patient was wearing work gloves when the injury occurred. Patient is unsure of his last tetanus.   HPI  Past Medical History:  Diagnosis Date  . Hyperlipidemia   . Hypertension     Patient Active Problem List   Diagnosis Date Noted  . Diabetes (HCC) 04/12/2015  . Nephrolithiasis 04/12/2015  . Gallstone 04/12/2015  . Essential hypertension 04/12/2015    History reviewed. No pertinent surgical history.      Home Medications    Prior to Admission medications   Medication Sig Start Date End Date Taking? Authorizing Provider  hydrochlorothiazide (HYDRODIURIL) 25 MG tablet Take 25 mg by mouth daily.    [provider]  metoprolol succinate (TOPROL-XL) 50 MG 24 hr tablet Take 50 mg by mouth daily. Take with or immediately following a meal.    [provider]  pantoprazole (PROTONIX) 40 MG tablet Take 1 tablet (40 mg total) by mouth daily. 05/10/15   Tilden Fossa, MD  rosuvastatin (CRESTOR) 10 MG tablet Take 10 mg by mouth daily.    [provider]  traMADol (ULTRAM) 50 MG tablet Take 1 tablet (50 mg total) by mouth every 6 (six) hours as needed. 06/19/17   Janne Napoleon, NP    Family History Family History  Problem Relation Age of Onset  . Alzheimer's disease Mother   . Heart attack Father   . Parkinson's disease Brother     Social History Social History   Tobacco Use  . Smoking status: Never Smoker  Substance Use Topics  . Alcohol use: No    Alcohol/week: 0.0 oz  . Drug use: No      Allergies   Patient has no known allergies.   Review of Systems Review of Systems  Musculoskeletal: Positive for arthralgias.       Left index finger  Skin: Positive for wound.  All other systems reviewed and are negative.    Physical Exam Updated Vital Signs BP (!) 175/104 (BP Location: Right Arm)   Pulse (!) 101   Temp 98.3 F (36.8 C) (Oral)   SpO2 98%   Physical Exam  Constitutional: He appears well-developed and well-nourished. No distress.  HENT:  Head: Normocephalic and atraumatic.  Eyes: EOM are normal.  Neck: Neck supple.  Cardiovascular: Tachycardia present.  Pulmonary/Chest: Effort normal.  Musculoskeletal:       Left hand: He exhibits tenderness, laceration and swelling. Normal sensation noted. Normal strength noted. He exhibits no thumb/finger opposition.  Laceration to the dorsum of the left index finger at the nail bed, abrasion at the base of the finger. Ecchymosis and laceration to the palmar aspect of the distal aspect of the left index finger.   Neurological: He is alert.  Skin: Skin is warm and dry.  Psychiatric: He has a normal mood and affect.  Nursing note and vitals reviewed.    ED Treatments / Results  Labs (all labs ordered are listed, but only abnormal results are displayed) Labs Reviewed - No  data to display  EKG None  Radiology Dg Finger Index Left  Result Date: 06/19/2017 CLINICAL DATA:  72 y/o M; finger cough in wood splitter, crush injury. EXAM: LEFT INDEX FINGER 2+V COMPARISON:  None. FINDINGS: There is no evidence of fracture or dislocation. There is no evidence of arthropathy or other focal bone abnormality. IMPRESSION: No acute fracture or dislocation identified. Electronically Signed   By: Mitzi Hansen M.D.   On: 06/19/2017 21:21    Procedures .Nerve Block Date/Time: 06/19/2017 10:13 PM Performed by: Janne Napoleon, NP Authorized by: Janne Napoleon, NP   Consent:    Consent obtained:  Verbal   Consent  given by:  Patient   Risks discussed:  Swelling and pain   Alternatives discussed:  Referral Indications:    Indications:  Pain relief and procedural anesthesia Location:    Body area:  Upper extremity (left index finger)   Laterality:  Left Pre-procedure details:    Skin preparation:  Alcohol   Preparation: Patient was prepped and draped in usual sterile fashion   Skin anesthesia (see MAR for exact dosages):    Skin anesthesia method:  None Procedure details (see MAR for exact dosages):    Block needle gauge:  25 G   Anesthetic injected:  Bupivacaine 0.25% WITH epi and lidocaine 1% w/o epi   Steroid injected:  None   Additive injected:  None   Injection procedure:  Anatomic landmarks identified, incremental injection, negative aspiration for blood and anatomic landmarks palpated Post-procedure details:    Outcome:  Anesthesia achieved   Patient tolerance of procedure:  Tolerated well, no immediate complications  .Marland KitchenLaceration Repair Date/Time: 06/19/2017 10:50 PM Performed by: Janne Napoleon, NP Authorized by: Janne Napoleon, NP   Consent:    Consent obtained:  Verbal   Consent given by:  Patient   Risks discussed:  Infection, pain, retained foreign body and poor cosmetic result   Alternatives discussed:  No treatment Laceration details:    Location:  Finger   Finger location:  L index finger   Length (cm):  3 Repair type:    Repair type:  Simple Pre-procedure details:    Preparation:  Patient was prepped and draped in usual sterile fashion and imaging obtained to evaluate for foreign bodies Exploration:    Hemostasis achieved with:  Direct pressure   Wound exploration: entire depth of wound probed and visualized     Wound extent: foreign bodies/material   Treatment:    Area cleansed with:  Betadine and saline   Amount of cleaning:  Extensive   Irrigation solution:  Sterile saline   Irrigation method:  Syringe   Visualized foreign bodies/material removed: yes   Skin  repair:    Repair method:  Sutures   Suture size:  4-0   Suture material:  Prolene   Suture technique:  Simple interrupted   Number of sutures:  4 Approximation:    Approximation:  Loose Post-procedure details:    Dressing:  Antibiotic ointment, non-adherent dressing, sterile dressing and splint for protection   Patient tolerance of procedure:  Tolerated well, no immediate complications Comments:     Tetanus updated .Marland KitchenLaceration Repair Date/Time: 06/19/2017 10:52 PM Performed by: Janne Napoleon, NP Authorized by: Janne Napoleon, NP   Consent:    Consent obtained:  Verbal   Consent given by:  Patient Anesthesia (see MAR for exact dosages):    Anesthesia method:  Nerve block Laceration details:    Location:  Finger  Finger location:  L index finger   Length (cm):  1 Repair type:    Repair type:  Simple Pre-procedure details:    Preparation:  Patient was prepped and draped in usual sterile fashion and imaging obtained to evaluate for foreign bodies Exploration:    Hemostasis achieved with:  Direct pressure   Wound exploration: entire depth of wound probed and visualized     Wound extent: no foreign bodies/material noted   Treatment:    Area cleansed with:  Saline and Betadine   Amount of cleaning:  Extensive   Irrigation solution:  Sterile saline   Irrigation method:  Syringe Skin repair:    Repair method:  Sutures   Suture size:  4-0   Suture material:  Prolene   Suture technique:  Simple interrupted   Number of sutures:  1 Approximation:    Approximation:  Loose Post-procedure details:    Dressing:  Antibiotic ointment and sterile dressing Comments:     This was the second laceration on the dorsum of the finger.    (including critical care time)  Medications Ordered in ED Medications  bacitracin ointment (1 application Topical Given 06/19/17 2253)  lidocaine (PF) (XYLOCAINE) 1 % injection 5 mL (5 mLs Infiltration Given 06/19/17 2253)  bupivacaine (PF) (MARCAINE)  0.25 % injection 5 mL (5 mLs Infiltration Given 06/19/17 2254)  Tdap (BOOSTRIX) injection 0.5 mL (0.5 mLs Intramuscular Given 06/19/17 2253)     Initial Impression / Assessment and Plan / ED Course  I have reviewed the triage vital signs and the nursing notes.  72 y.o. male with lacerations to the left index finger on the palmar aspect and dorsum of the finger. Also with abrasions to the finger stable for d/c without focal neuro deficits and no fracture or dislocation noted on x-ray. Dr. Adela LankFloyd in to examine the patient. Will have patient f/u for suture removal in 7 days. Dr. Adela LankFloyd discussed with the patient that we will not start antibiotics, however, if he develops signs of infection he should return immediately. Patient agrees with plan.   Final Clinical Impressions(s) / ED Diagnoses   Final diagnoses:  Laceration of left index finger with damage to nail, foreign body presence unspecified, initial encounter  Subungual hematoma of digit of hand, initial encounter  Contusion of finger with damage to nail, initial encounter    ED Discharge Orders        Ordered    traMADol (ULTRAM) 50 MG tablet  Every 6 hours PRN     06/19/17 2242       Janne NapoleonNeese, German Manke M, NP 06/19/17 2302    Melene PlanFloyd, Dan, DO 06/19/17 2340

## 2017-06-22 ENCOUNTER — Encounter (HOSPITAL_COMMUNITY): Payer: Self-pay | Admitting: Emergency Medicine

## 2017-06-22 ENCOUNTER — Emergency Department (HOSPITAL_COMMUNITY)
Admission: EM | Admit: 2017-06-22 | Discharge: 2017-06-22 | Disposition: A | Discharge status: Home / Self Care | Payer: Medicare Other | Attending: Emergency Medicine | Admitting: Emergency Medicine

## 2017-06-22 DIAGNOSIS — S61210D Laceration without foreign body of right index finger without damage to nail, subsequent encounter: Secondary | ICD-10-CM | POA: Insufficient documentation

## 2017-06-22 DIAGNOSIS — E119 Type 2 diabetes mellitus without complications: Secondary | ICD-10-CM | POA: Diagnosis not present

## 2017-06-22 DIAGNOSIS — Z79899 Other long term (current) drug therapy: Secondary | ICD-10-CM | POA: Insufficient documentation

## 2017-06-22 DIAGNOSIS — I10 Essential (primary) hypertension: Secondary | ICD-10-CM | POA: Diagnosis not present

## 2017-06-22 DIAGNOSIS — W268XXD Contact with other sharp object(s), not elsewhere classified, subsequent encounter: Secondary | ICD-10-CM | POA: Diagnosis not present

## 2017-06-22 DIAGNOSIS — Z5189 Encounter for other specified aftercare: Secondary | ICD-10-CM | POA: Insufficient documentation

## 2017-06-22 DIAGNOSIS — R2231 Localized swelling, mass and lump, right upper limb: Secondary | ICD-10-CM | POA: Insufficient documentation

## 2017-06-22 DIAGNOSIS — Z4800 Encounter for change or removal of nonsurgical wound dressing: Secondary | ICD-10-CM | POA: Diagnosis present

## 2017-06-22 DIAGNOSIS — S61311D Laceration without foreign body of left index finger with damage to nail, subsequent encounter: Secondary | ICD-10-CM | POA: Diagnosis not present

## 2017-06-22 MED ORDER — CEPHALEXIN 500 MG PO CAPS: 500.0000 mg | ORAL_CAPSULE | Freq: Four times a day (QID) | ORAL | 0 refills | Status: AC

## 2017-06-22 NOTE — ED Notes (Signed)
Bed: [facility] Expected date:  Expected time:  Means of arrival:  Comments: 

## 2017-06-22 NOTE — ED Provider Notes (Signed)
[facility]-EMERGENCY DEPT Provider Note   CSN: [id number] Arrival date & time: 06/22/17  2054     History   Chief Complaint Chief Complaint  Patient presents with  . Wound Check    HPI Maxwell Thomas is a 72 y.o. male.  HPI   Patient is 72 year old male with history of hyperlipidemia and hypertension presenting to the ED today for a wound check.  Patient was cutting wood on 06/19/17 when he cut his left index finger.  Was seen at [facility] emergency department and had 4 sutures placed to the left index finger dorsal aspect, and one suture placed just inferior to the nail bed.  States he has had a bandage to the index finger and he took it off today.  States that it looks like there is fat coming to the stitches and he is wondering if he needs more stitches.  He denies any fevers or increased redness to the finger.  Does note pain to the left finger that has been constant since the injury.  Denies any numbness to the finger.  Does have swelling to left index finger and decreased flexion due to swelling.  Was concerned that he had a small amount of drainage from the wound.  Past Medical History:  Diagnosis Date  . Hyperlipidemia   . Hypertension     Patient Active Problem List   Diagnosis Date Noted  . Diabetes (HCC) 04/12/2015  . Nephrolithiasis 04/12/2015  . Gallstone 04/12/2015  . Essential hypertension 04/12/2015    History reviewed. No pertinent surgical history.      Home Medications    Prior to Admission medications   Medication Sig Start Date End Date Taking? Authorizing Provider  cephALEXin (KEFLEX) 500 MG capsule Take 1 capsule (500 mg total) by mouth 4 (four) times daily for 5 days. 06/22/17 06/27/17  Mikayla Chiusano S, PA-C  hydrochlorothiazide (HYDRODIURIL) 25 MG tablet Take 25 mg by mouth daily.    [provider]  metoprolol succinate (TOPROL-XL) 50 MG 24 hr tablet Take 50 mg by mouth daily. Take with or immediately following a  meal.    [provider]  pantoprazole (PROTONIX) 40 MG tablet Take 1 tablet (40 mg total) by mouth daily. 05/10/15   Tilden Fossa, MD  rosuvastatin (CRESTOR) 10 MG tablet Take 10 mg by mouth daily.    [provider]  traMADol (ULTRAM) 50 MG tablet Take 1 tablet (50 mg total) by mouth every 6 (six) hours as needed. 06/19/17   Janne Napoleon, NP    Family History Family History  Problem Relation Age of Onset  . Alzheimer's disease Mother   . Heart attack Father   . Parkinson's disease Brother     Social History Social History   Tobacco Use  . Smoking status: Never Smoker  Substance Use Topics  . Alcohol use: No    Alcohol/week: 0.0 oz  . Drug use: No     Allergies   Patient has no known allergies.   Review of Systems Review of Systems  Constitutional: Negative for fever.  Musculoskeletal:       Left finger pain and swelling, drainage     Physical Exam Updated Vital Signs BP 140/84 (BP Location: Right Arm)   Pulse 82   Temp 97.9 F (36.6 C) (Oral)   Resp 17   SpO2 98%   Physical Exam  Constitutional: He is oriented to person, place, and time. He appears well-developed and well-nourished. No distress.  Eyes:  Conjunctivae are normal.  Cardiovascular: Normal rate and regular rhythm.  Pulmonary/Chest: Effort normal and breath sounds normal.  Musculoskeletal:  Left index finger has 4 stiches in place with loose approximation to dorsal aspect of left index finger. 1 suture in place inferior to nail bed. TTP along sutures with swelling to index finger. No evidence of surrounding cellulitis. NVI. Decreased flexion at DIP due to swelling. PIP and MCP flexion intact. Extension and strength intact to finger. Ecchymosis to finger.  Neurological: He is alert and oriented to person, place, and time.  Skin: Skin is warm and dry.       ED Treatments / Results  Labs (all labs ordered are listed, but only abnormal results are displayed) Labs Reviewed - No  data to display  EKG None  Radiology No results found.  Procedures Procedures (including critical care time)  Medications Ordered in ED Medications - No data to display   Initial Impression / Assessment and Plan / ED Course  I have reviewed the triage vital signs and the nursing notes.  Pertinent labs & imaging results that were available during my care of the patient were reviewed by me and considered in my medical decision making (see chart for details).    Discussed pt presentation and exam findings with Dr. Denton LankSteinl, who evaluated pt and agrees with plan.   Final Clinical Impressions(s) / ED Diagnoses   Final diagnoses:  Visit for wound check   Pt presenting for wound check after he had sutures placed.  Wound does not appear to be grossly infected however patient concerned that sutures are loosely approximated, and felt that there may have been some drainage from wound.  Patient is neurovascularly intact.  There is no wound dehiscence.  Sutures are still intact.  Will place patient on Keflex given his concern for drainage from the wound as well as his history of prediabetes.  Advised him to follow-up in 7-10 days for suture removal.  Discussed wound care precautions and to return for signs of infection.  All questions answered and patient understands the plan and reasons return.  ED Discharge Orders        Ordered    cephALEXin (KEFLEX) 500 MG capsule  4 times daily     06/22/17 2226       Karrie MeresCouture, Jaz Laningham S, PA-C 06/23/17 0020    Cathren LaineSteinl, Kevin, MD 06/27/17 1609

## 2017-06-22 NOTE — ED Triage Notes (Signed)
Patient reports getting sutures to left index finger x3 days ago after cutting finger with wood splitter. Reports "it looks like the fat is coming out through the stitches." Movement and sensation to finger.

## 2017-06-22 NOTE — Discharge Instructions (Addendum)
You were given a prescription for antibiotics. Please take the antibiotic prescription fully.   I have prescribed a new medication for you today. It is important that when you pick the prescription up you discuss the potential interactions of this medication with other medications you are taking, including over the counter medications, with the pharmacists.   This new medication has potential side effects. Be sure to contact your primary care provider or return to the emergency department if you are experiencing new symptoms that you are unable to tolerate after starting the medication. You need to receive medical evaluation immediately if you start to experience blistering of the skin, rash, swelling, or difficulty breathing as these signs could indicate a more serious medication side effect.   Please follow-up as scheduled to have sutures removed in 4 days.  Return to the emergency department, urgent care, or primary care for any signs of infection.

## 2017-06-30 DIAGNOSIS — S61219A Laceration without foreign body of unspecified finger without damage to nail, initial encounter: Secondary | ICD-10-CM | POA: Diagnosis not present

## 2017-06-30 DIAGNOSIS — Z4802 Encounter for removal of sutures: Secondary | ICD-10-CM | POA: Diagnosis not present

## 2017-09-29 DIAGNOSIS — I10 Essential (primary) hypertension: Secondary | ICD-10-CM | POA: Diagnosis not present

## 2017-09-29 DIAGNOSIS — E782 Mixed hyperlipidemia: Secondary | ICD-10-CM | POA: Diagnosis not present

## 2017-09-29 DIAGNOSIS — L570 Actinic keratosis: Secondary | ICD-10-CM | POA: Diagnosis not present

## 2017-09-29 DIAGNOSIS — E119 Type 2 diabetes mellitus without complications: Secondary | ICD-10-CM | POA: Diagnosis not present

## 2017-09-29 DIAGNOSIS — Z1159 Encounter for screening for other viral diseases: Secondary | ICD-10-CM | POA: Diagnosis not present

## 2018-01-19 DIAGNOSIS — Z86010 Personal history of colonic polyps: Secondary | ICD-10-CM | POA: Diagnosis not present

## 2018-01-19 DIAGNOSIS — K648 Other hemorrhoids: Secondary | ICD-10-CM | POA: Diagnosis not present

## 2018-04-07 DIAGNOSIS — L570 Actinic keratosis: Secondary | ICD-10-CM | POA: Diagnosis not present

## 2018-04-07 DIAGNOSIS — E119 Type 2 diabetes mellitus without complications: Secondary | ICD-10-CM | POA: Diagnosis not present

## 2018-04-07 DIAGNOSIS — E782 Mixed hyperlipidemia: Secondary | ICD-10-CM | POA: Diagnosis not present

## 2018-04-07 DIAGNOSIS — I10 Essential (primary) hypertension: Secondary | ICD-10-CM | POA: Diagnosis not present

## 2018-06-01 DIAGNOSIS — Z0120 Encounter for dental examination and cleaning without abnormal findings: Secondary | ICD-10-CM | POA: Diagnosis not present

## 2018-12-07 DIAGNOSIS — Z0120 Encounter for dental examination and cleaning without abnormal findings: Secondary | ICD-10-CM | POA: Diagnosis not present

## 2018-12-19 DIAGNOSIS — Z23 Encounter for immunization: Secondary | ICD-10-CM | POA: Diagnosis not present

## 2019-06-09 DIAGNOSIS — Z0120 Encounter for dental examination and cleaning without abnormal findings: Secondary | ICD-10-CM | POA: Diagnosis not present

## 2019-12-14 DIAGNOSIS — Z0120 Encounter for dental examination and cleaning without abnormal findings: Secondary | ICD-10-CM | POA: Diagnosis not present

## 2020-01-22 DIAGNOSIS — Z23 Encounter for immunization: Secondary | ICD-10-CM | POA: Diagnosis not present

## 2020-06-27 DIAGNOSIS — I10 Essential (primary) hypertension: Secondary | ICD-10-CM | POA: Diagnosis not present

## 2020-06-27 DIAGNOSIS — E1140 Type 2 diabetes mellitus with diabetic neuropathy, unspecified: Secondary | ICD-10-CM | POA: Diagnosis not present

## 2020-06-27 DIAGNOSIS — L570 Actinic keratosis: Secondary | ICD-10-CM | POA: Diagnosis not present

## 2020-06-27 DIAGNOSIS — E782 Mixed hyperlipidemia: Secondary | ICD-10-CM | POA: Diagnosis not present

## 2020-08-03 DIAGNOSIS — L718 Other rosacea: Secondary | ICD-10-CM | POA: Diagnosis not present

## 2020-08-03 DIAGNOSIS — D0461 Carcinoma in situ of skin of right upper limb, including shoulder: Secondary | ICD-10-CM | POA: Diagnosis not present

## 2020-08-03 DIAGNOSIS — D1801 Hemangioma of skin and subcutaneous tissue: Secondary | ICD-10-CM | POA: Diagnosis not present

## 2020-08-03 DIAGNOSIS — L819 Disorder of pigmentation, unspecified: Secondary | ICD-10-CM | POA: Diagnosis not present

## 2020-08-03 DIAGNOSIS — C44622 Squamous cell carcinoma of skin of right upper limb, including shoulder: Secondary | ICD-10-CM | POA: Diagnosis not present

## 2020-08-03 DIAGNOSIS — L814 Other melanin hyperpigmentation: Secondary | ICD-10-CM | POA: Diagnosis not present

## 2020-08-03 DIAGNOSIS — C44529 Squamous cell carcinoma of skin of other part of trunk: Secondary | ICD-10-CM | POA: Diagnosis not present

## 2020-08-09 DIAGNOSIS — C44599 Other specified malignant neoplasm of skin of other part of trunk: Secondary | ICD-10-CM | POA: Diagnosis not present

## 2020-08-09 DIAGNOSIS — C44519 Basal cell carcinoma of skin of other part of trunk: Secondary | ICD-10-CM | POA: Diagnosis not present

## 2020-08-09 DIAGNOSIS — C44529 Squamous cell carcinoma of skin of other part of trunk: Secondary | ICD-10-CM | POA: Diagnosis not present

## 2020-09-12 DIAGNOSIS — D485 Neoplasm of uncertain behavior of skin: Secondary | ICD-10-CM | POA: Diagnosis not present

## 2020-09-12 DIAGNOSIS — Z85828 Personal history of other malignant neoplasm of skin: Secondary | ICD-10-CM | POA: Diagnosis not present

## 2020-09-12 DIAGNOSIS — C44519 Basal cell carcinoma of skin of other part of trunk: Secondary | ICD-10-CM | POA: Diagnosis not present

## 2020-09-12 DIAGNOSIS — L905 Scar conditions and fibrosis of skin: Secondary | ICD-10-CM | POA: Diagnosis not present

## 2020-10-19 DIAGNOSIS — L905 Scar conditions and fibrosis of skin: Secondary | ICD-10-CM | POA: Diagnosis not present

## 2020-10-19 DIAGNOSIS — Z85828 Personal history of other malignant neoplasm of skin: Secondary | ICD-10-CM | POA: Diagnosis not present

## 2020-10-19 DIAGNOSIS — L570 Actinic keratosis: Secondary | ICD-10-CM | POA: Diagnosis not present

## 2020-10-19 DIAGNOSIS — C44212 Basal cell carcinoma of skin of right ear and external auricular canal: Secondary | ICD-10-CM | POA: Diagnosis not present

## 2020-10-19 DIAGNOSIS — D485 Neoplasm of uncertain behavior of skin: Secondary | ICD-10-CM | POA: Diagnosis not present

## 2020-12-26 DIAGNOSIS — C44212 Basal cell carcinoma of skin of right ear and external auricular canal: Secondary | ICD-10-CM | POA: Diagnosis not present

## 2020-12-30 DIAGNOSIS — Z23 Encounter for immunization: Secondary | ICD-10-CM | POA: Diagnosis not present

## 2021-01-22 DIAGNOSIS — L821 Other seborrheic keratosis: Secondary | ICD-10-CM | POA: Diagnosis not present

## 2021-01-22 DIAGNOSIS — L814 Other melanin hyperpigmentation: Secondary | ICD-10-CM | POA: Diagnosis not present

## 2021-01-22 DIAGNOSIS — D225 Melanocytic nevi of trunk: Secondary | ICD-10-CM | POA: Diagnosis not present

## 2021-01-22 DIAGNOSIS — L819 Disorder of pigmentation, unspecified: Secondary | ICD-10-CM | POA: Diagnosis not present

## 2021-01-30 DIAGNOSIS — Z0000 Encounter for general adult medical examination without abnormal findings: Secondary | ICD-10-CM | POA: Diagnosis not present

## 2021-01-30 DIAGNOSIS — E1140 Type 2 diabetes mellitus with diabetic neuropathy, unspecified: Secondary | ICD-10-CM | POA: Diagnosis not present

## 2021-01-30 DIAGNOSIS — E782 Mixed hyperlipidemia: Secondary | ICD-10-CM | POA: Diagnosis not present

## 2021-01-30 DIAGNOSIS — I10 Essential (primary) hypertension: Secondary | ICD-10-CM | POA: Diagnosis not present

## 2021-04-24 DIAGNOSIS — Z08 Encounter for follow-up examination after completed treatment for malignant neoplasm: Secondary | ICD-10-CM | POA: Diagnosis not present

## 2021-04-24 DIAGNOSIS — Z85828 Personal history of other malignant neoplasm of skin: Secondary | ICD-10-CM | POA: Diagnosis not present

## 2021-04-24 DIAGNOSIS — L570 Actinic keratosis: Secondary | ICD-10-CM | POA: Diagnosis not present

## 2021-04-24 DIAGNOSIS — C44329 Squamous cell carcinoma of skin of other parts of face: Secondary | ICD-10-CM | POA: Diagnosis not present

## 2021-04-24 DIAGNOSIS — L910 Hypertrophic scar: Secondary | ICD-10-CM | POA: Diagnosis not present

## 2021-06-11 DIAGNOSIS — L910 Hypertrophic scar: Secondary | ICD-10-CM | POA: Diagnosis not present

## 2021-06-11 DIAGNOSIS — L821 Other seborrheic keratosis: Secondary | ICD-10-CM | POA: Diagnosis not present

## 2021-06-11 DIAGNOSIS — L819 Disorder of pigmentation, unspecified: Secondary | ICD-10-CM | POA: Diagnosis not present

## 2021-06-11 DIAGNOSIS — D0439 Carcinoma in situ of skin of other parts of face: Secondary | ICD-10-CM | POA: Diagnosis not present

## 2021-06-22 ENCOUNTER — Encounter (HOSPITAL_COMMUNITY): Payer: Self-pay | Admitting: Emergency Medicine

## 2021-06-22 ENCOUNTER — Other Ambulatory Visit: Payer: Self-pay

## 2021-06-22 ENCOUNTER — Emergency Department (HOSPITAL_COMMUNITY): Payer: Medicare Other

## 2021-06-22 ENCOUNTER — Emergency Department (HOSPITAL_COMMUNITY)
Admission: EM | Admit: 2021-06-22 | Discharge: 2021-06-22 | Disposition: A | Discharge status: Home / Self Care | Payer: Medicare Other | Attending: Emergency Medicine | Admitting: Emergency Medicine

## 2021-06-22 DIAGNOSIS — R29818 Other symptoms and signs involving the nervous system: Secondary | ICD-10-CM | POA: Diagnosis not present

## 2021-06-22 DIAGNOSIS — M79602 Pain in left arm: Secondary | ICD-10-CM | POA: Insufficient documentation

## 2021-06-22 DIAGNOSIS — Z79899 Other long term (current) drug therapy: Secondary | ICD-10-CM | POA: Diagnosis not present

## 2021-06-22 DIAGNOSIS — E119 Type 2 diabetes mellitus without complications: Secondary | ICD-10-CM | POA: Insufficient documentation

## 2021-06-22 DIAGNOSIS — M2578 Osteophyte, vertebrae: Secondary | ICD-10-CM | POA: Diagnosis not present

## 2021-06-22 DIAGNOSIS — R519 Headache, unspecified: Secondary | ICD-10-CM | POA: Insufficient documentation

## 2021-06-22 DIAGNOSIS — R531 Weakness: Secondary | ICD-10-CM | POA: Diagnosis not present

## 2021-06-22 DIAGNOSIS — R000 Tachycardia, unspecified: Secondary | ICD-10-CM | POA: Diagnosis not present

## 2021-06-22 DIAGNOSIS — I10 Essential (primary) hypertension: Secondary | ICD-10-CM | POA: Insufficient documentation

## 2021-06-22 DIAGNOSIS — G319 Degenerative disease of nervous system, unspecified: Secondary | ICD-10-CM | POA: Diagnosis not present

## 2021-06-22 DIAGNOSIS — M79604 Pain in right leg: Secondary | ICD-10-CM | POA: Diagnosis not present

## 2021-06-22 DIAGNOSIS — M79609 Pain in unspecified limb: Secondary | ICD-10-CM

## 2021-06-22 LAB — CBC WITH DIFFERENTIAL/PLATELET
Abs Immature Granulocytes: 0.01 10*3/uL (ref 0.00–0.07)
Basophils Absolute: 0.1 10*3/uL (ref 0.0–0.1)
Basophils Relative: 1 %
Eosinophils Absolute: 0.1 10*3/uL (ref 0.0–0.5)
Eosinophils Relative: 1 %
HCT: 45 % (ref 39.0–52.0)
Hemoglobin: 15.9 g/dL (ref 13.0–17.0)
Immature Granulocytes: 0 %
Lymphocytes Relative: 25 %
Lymphs Abs: 2.1 10*3/uL (ref 0.7–4.0)
MCH: 31.7 pg (ref 26.0–34.0)
MCHC: 35.3 g/dL (ref 30.0–36.0)
MCV: 89.6 fL (ref 80.0–100.0)
Monocytes Absolute: 0.6 10*3/uL (ref 0.1–1.0)
Monocytes Relative: 7 %
Neutro Abs: 5.4 10*3/uL (ref 1.7–7.7)
Neutrophils Relative %: 66 %
Platelets: 219 10*3/uL (ref 150–400)
RBC: 5.02 MIL/uL (ref 4.22–5.81)
RDW: 13.1 % (ref 11.5–15.5)
WBC: 8.2 10*3/uL (ref 4.0–10.5)
nRBC: 0 % (ref 0.0–0.2)

## 2021-06-22 LAB — RAPID URINE DRUG SCREEN, HOSP PERFORMED
Amphetamines: NOT DETECTED
Barbiturates: NOT DETECTED
Benzodiazepines: NOT DETECTED
Cocaine: NOT DETECTED
Opiates: NOT DETECTED
Tetrahydrocannabinol: NOT DETECTED

## 2021-06-22 LAB — COMPREHENSIVE METABOLIC PANEL
ALT: 23 U/L (ref 0–44)
AST: 25 U/L (ref 15–41)
Albumin: 4.7 g/dL (ref 3.5–5.0)
Alkaline Phosphatase: 58 U/L (ref 38–126)
Anion gap: 8 (ref 5–15)
BUN: 17 mg/dL (ref 8–23)
CO2: 27 mmol/L (ref 22–32)
Calcium: 9.2 mg/dL (ref 8.9–10.3)
Chloride: 104 mmol/L (ref 98–111)
Creatinine, Ser: 0.87 mg/dL (ref 0.61–1.24)
GFR, Estimated: >60 mL/min (ref >60)
Glucose, Bld: 152 mg/dL — ABNORMAL HIGH (ref 70–99)
Potassium: 4.8 mmol/L (ref 3.5–5.1)
Sodium: 139 mmol/L (ref 135–145)
Total Bilirubin: 1.1 mg/dL (ref 0.3–1.2)
Total Protein: 8 g/dL (ref 6.5–8.1)

## 2021-06-22 LAB — URINALYSIS, ROUTINE W REFLEX MICROSCOPIC
Bilirubin Urine: NEGATIVE
Glucose, UA: NEGATIVE mg/dL
Hgb urine dipstick: NEGATIVE
Ketones, ur: NEGATIVE mg/dL
Leukocytes,Ua: NEGATIVE
Nitrite: NEGATIVE
Protein, ur: NEGATIVE mg/dL
Specific Gravity, Urine: 1.024 (ref 1.005–1.030)
pH: 6 (ref 5.0–8.0)

## 2021-06-22 LAB — CBG MONITORING, ED: Glucose-Capillary: 137 mg/dL — ABNORMAL HIGH (ref 70–99)

## 2021-06-22 LAB — CK: Total CK: 79 U/L (ref 49–397)

## 2021-06-22 IMAGING — MR MR HEAD W/O CM
10 series · 48 of 48 positions shown · non-contrast
Comparison: CT head without contrast [DATE]

CLINICAL DATA: Neuro deficit, stroke suspected. Right leg pain.
Decreased straight in the left upper extremity.

EXAM:
MRI HEAD WITHOUT CONTRAST
TECHNIQUE: Multiplanar, multiecho pulse sequences of the brain and surrounding
structures were obtained without intravenous contrast.

[Series 5: DWI · axial · 3.0mm · 1.36mm/px · z∈[-16,+124]mm · 9 of 96 slices shown (1 of 2)]
[im 1/96]
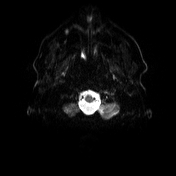
[im 12/96]
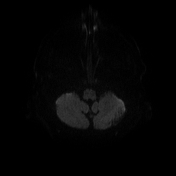
[im 24/96]
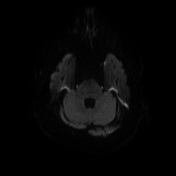
[im 36/96]
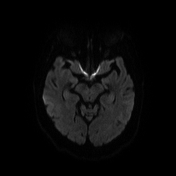
[im 48/96]
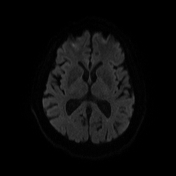
[im 60/96]
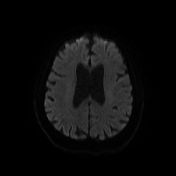
[im 72/96]
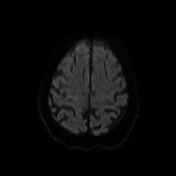
[im 84/96]
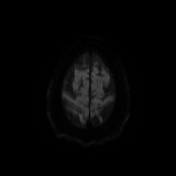
[im 96/96]
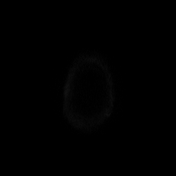

[Series 6: DWI · axial · 3.0mm · 1.36mm/px · z∈[-16,+124]mm · 4 of 48 slices shown (2 of 2)]
[im 1/48]
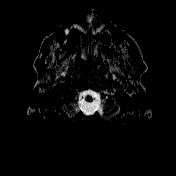
[im 16/48]
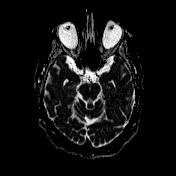
[im 32/48]
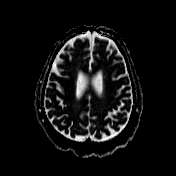
[im 48/48]
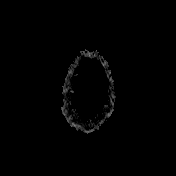

[Series 7: T1 · sagittal · 5.0mm · 0.75mm/px · 2 of 24 slices shown (1 of 2)]
[im 1/24]
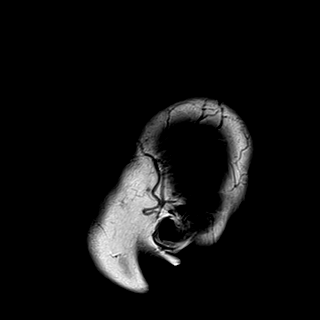
[im 24/24]
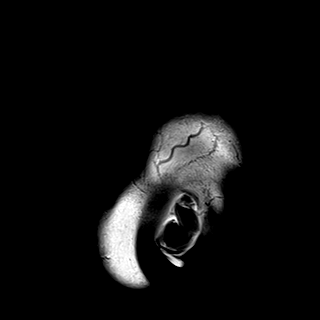

[Series 8: T2 · axial · 5.0mm · 0.62mm/px · z∈[-26,+134]mm · 2 of 26 slices shown (1 of 2)]
[im 1/26]
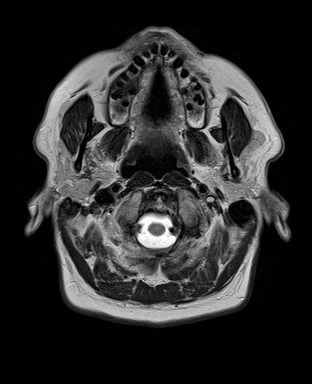
[im 26/26]
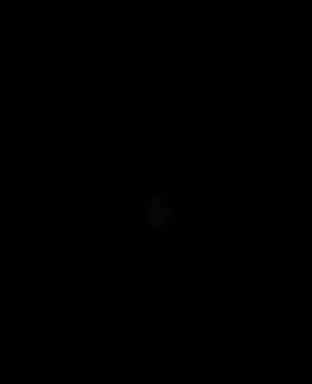

[Series 9: swi_images · axial · 3.0mm · 0.75mm/px · z∈[-27,+136]mm · 5 of 56 slices shown]
[im 1/56]
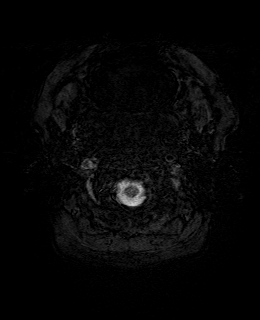
[im 14/56]
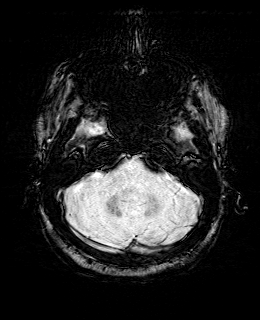
[im 28/56]
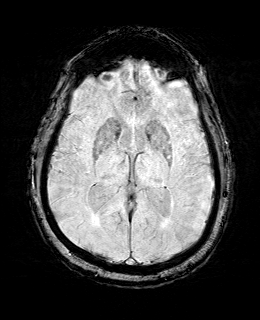
[im 42/56]
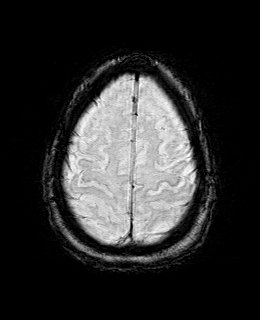
[im 56/56]
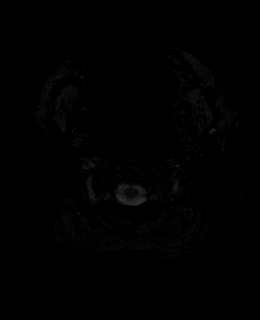

[Series 11: FLAIR · axial · 3.0mm · 0.75mm/px · z∈[-21,+130]mm · 4 of 52 slices shown]
[im 1/52]
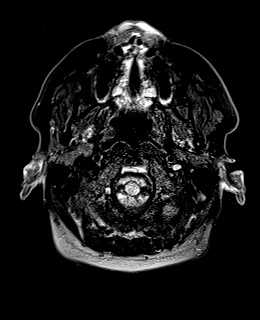
[im 18/52]
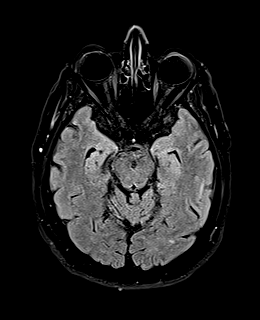
[im 35/52]
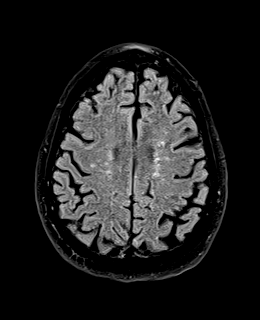
[im 52/52]
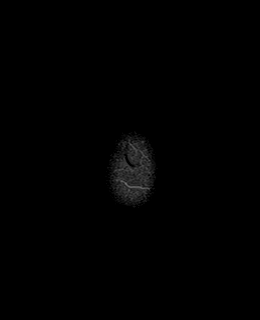

[Series 12: T1 · axial · 1.0mm · 0.94mm/px · z∈[-24,+133]mm · 13 of 160 slices shown (2 of 2)]
[im 1/160]
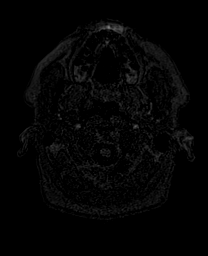
[im 14/160]
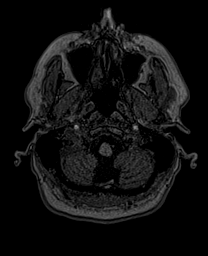
[im 27/160]
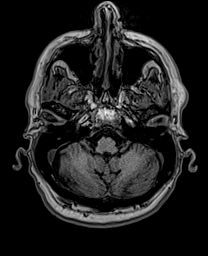
[im 40/160]
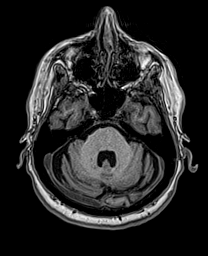
[im 54/160]
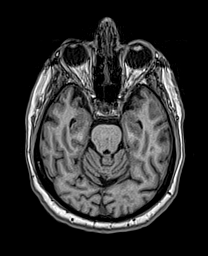
[im 67/160]
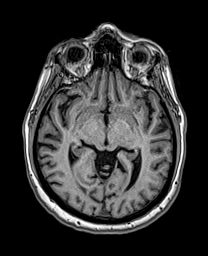
[im 80/160]
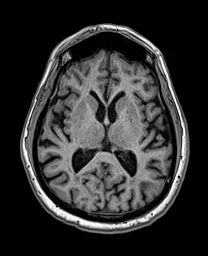
[im 93/160]
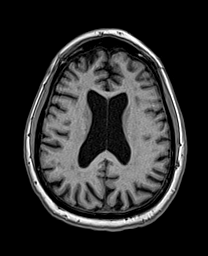
[im 107/160]
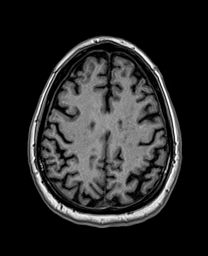
[im 120/160]
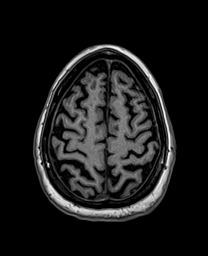
[im 133/160]
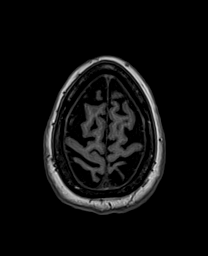
[im 146/160]
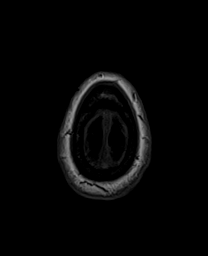
[im 160/160]
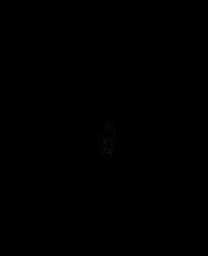

[Series 13: cor dwi_tracew · coronal · 5.0mm · 1.53mm/px · 5 of 56 slices shown]
[im 1/56]
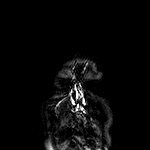
[im 14/56]
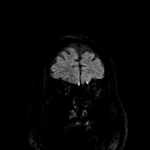
[im 28/56]
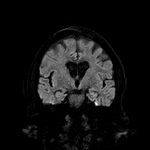
[im 42/56]
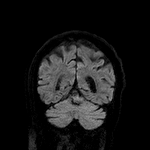
[im 56/56]
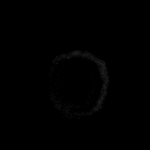

[Series 14: cor dwi_adc · coronal · 5.0mm · 1.53mm/px · 2 of 28 slices shown]
[im 1/28]
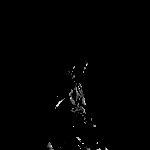
[im 28/28]
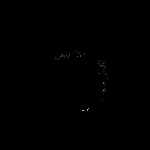

[Series 15: T2 · coronal · 5.0mm · 0.57mm/px · 2 of 28 slices shown (2 of 2)]
[im 1/28]
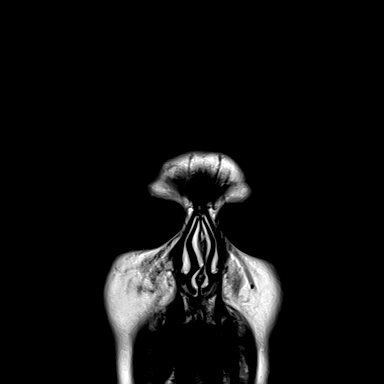
[im 28/28]
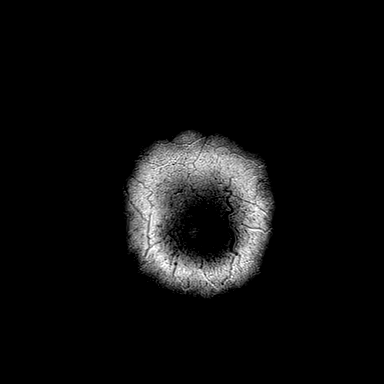

[48 of 48 positions shown; findings below may reference images not displayed]

FINDINGS: Brain: No acute infarct, hemorrhage, or mass lesion is present. The
ventricles are of normal size. Periventricular and subcortical T2
hyperintensities are mildly advanced for age. Mild generalized
atrophy is also noted.

No significant extraaxial fluid collection is present. The internal
auditory canals are within normal limits. The brainstem and
cerebellum are within normal limits.

Vascular: Flow is present in the major intracranial arteries.

Skull and upper cervical spine: The craniocervical junction is
normal. Upper cervical spine is within normal limits. Marrow signal
is unremarkable.

Sinuses/Orbits: The paranasal sinuses and mastoid air cells are
clear. The globes and orbits are within normal limits.

Other:
IMPRESSION: 1. No acute intracranial abnormality.
2. Periventricular and subcortical T2 hyperintensities bilaterally
are mildly advanced for age. The finding is nonspecific but can be
seen in the setting of chronic microvascular ischemia, a
demyelinating process such as multiple sclerosis, vasculitis,
complicated migraine headaches, or as the sequelae of a prior
infectious or inflammatory process.
3.  Cerebral Atrophy ([O0]-[O0]).

## 2021-06-22 IMAGING — MR MR CERVICAL SPINE W/O CM
6 series · 35 of 48 positions shown · non-contrast
Comparison: None.

CLINICAL DATA: Myelopathy, acute

EXAM:
MRI CERVICAL SPINE WITHOUT CONTRAST
TECHNIQUE: Multiplanar, multisequence MR imaging of the cervical spine was
performed. No intravenous contrast was administered.

[Series 5: T1 · sagittal · 3.0mm · 0.69mm/px · 6 of 15 slices shown (1 of 2)]
[im 1/15]
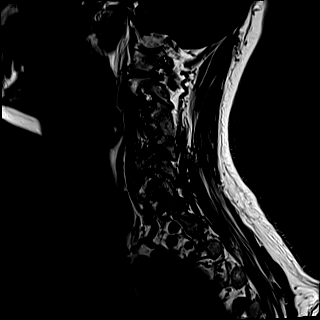
[im 3/15]
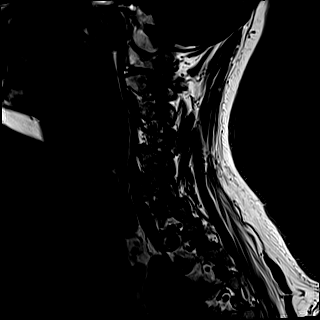
[im 6/15]
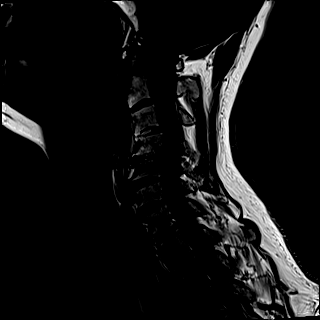
[im 9/15]
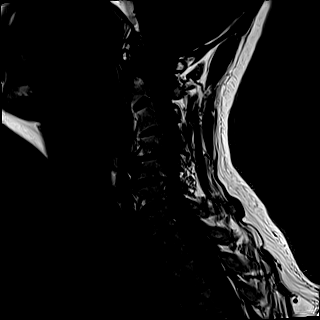
[im 12/15]
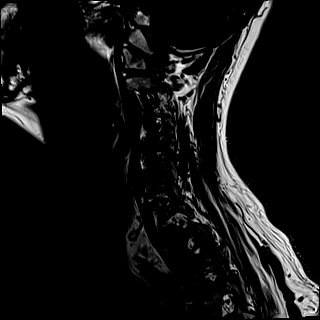
[im 15/15]
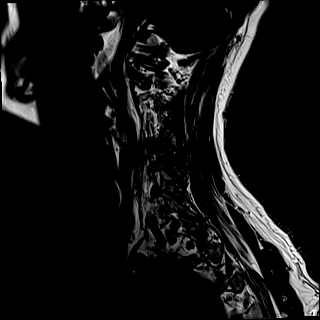

[Series 6: T2 · sagittal · 3.0mm · 0.69mm/px · 6 of 15 slices shown (1 of 2)]
[im 1/15]
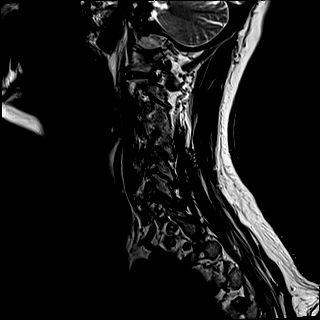
[im 3/15]
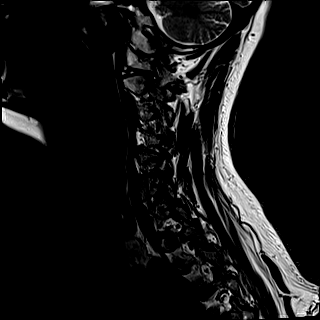
[im 6/15]
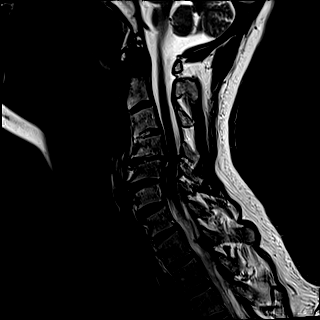
[im 9/15]
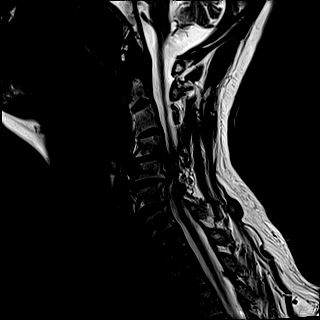
[im 12/15]
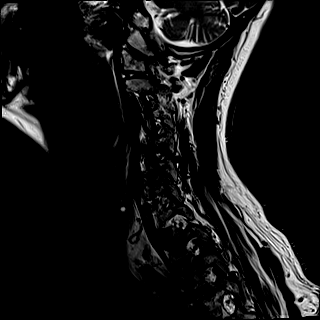
[im 15/15]
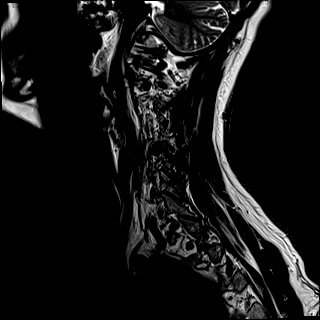

[Series 7: STIR · sagittal · 3.0mm · 0.86mm/px · 6 of 15 slices shown]
[im 1/15]
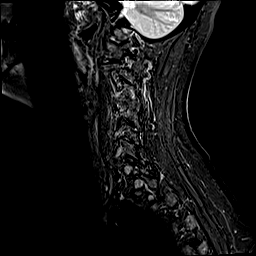
[im 3/15]
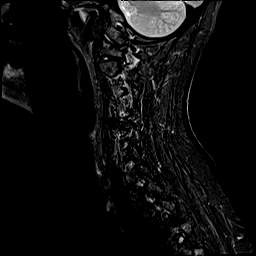
[im 6/15]
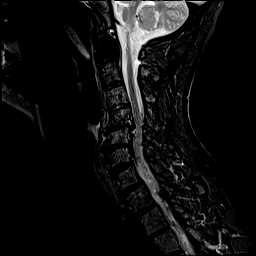
[im 9/15]
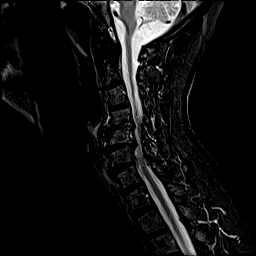
[im 12/15]
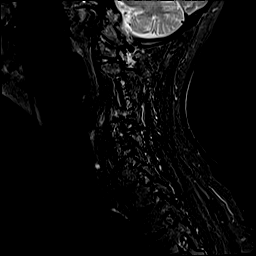
[im 15/15]
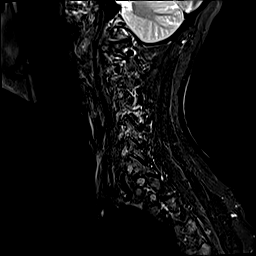

[Series 8: T2 · axial · 3.0mm · 0.70mm/px · z∈[-78,+28]mm · 8 of 27 slices shown (2 of 2)]
[im 1/27]
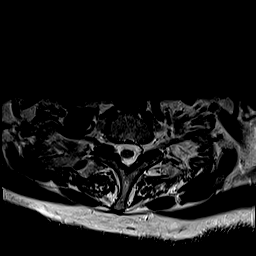
[im 3/27]
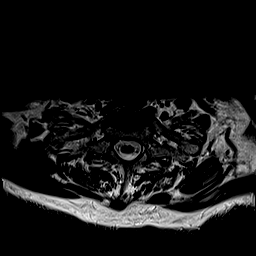
[im 9/27]
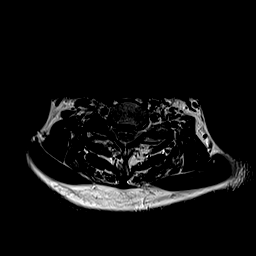
[im 12/27]
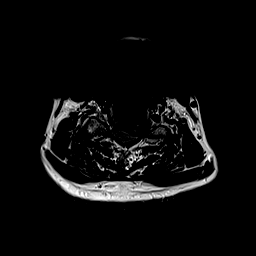
[im 15/27]
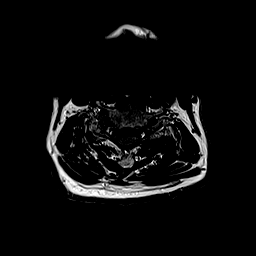
[im 18/27]
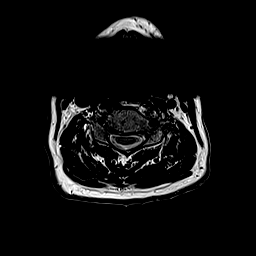
[im 24/27]
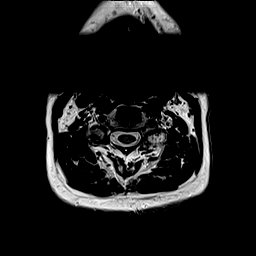
[im 27/27]
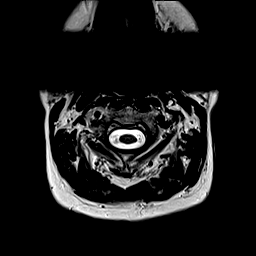

[Series 9: GRE · axial · 3.0mm · 0.35mm/px · 1 of 27 slices shown]
[im 1/27]
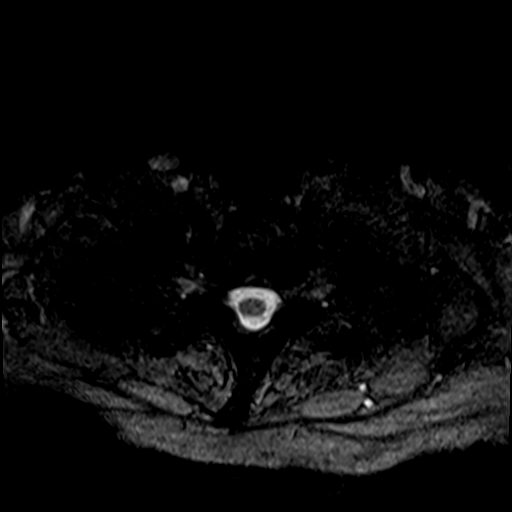

[Series 10: T1 · axial · 3.0mm · 0.35mm/px · z∈[-78,+28]mm · 8 of 27 slices shown (2 of 2)]
[im 1/27]
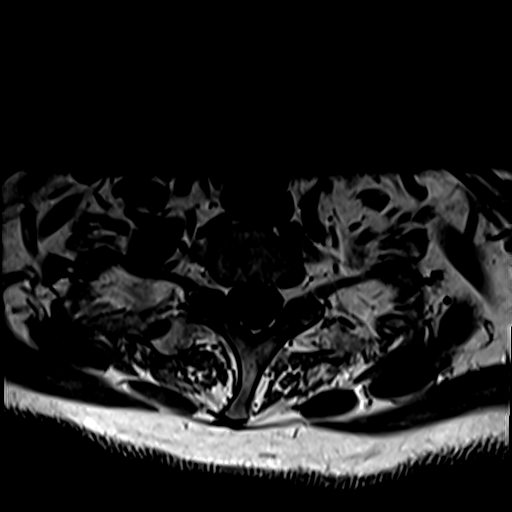
[im 3/27]
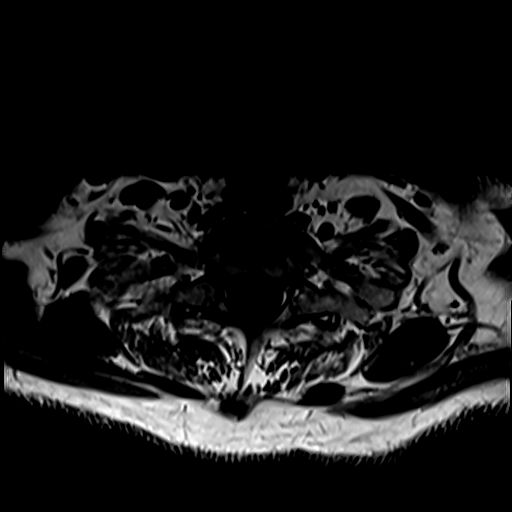
[im 9/27]
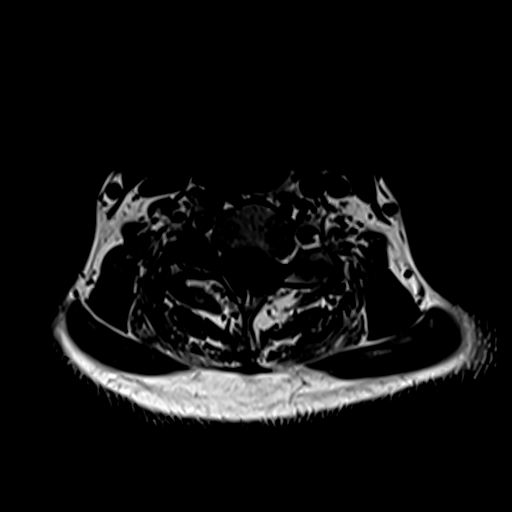
[im 12/27]
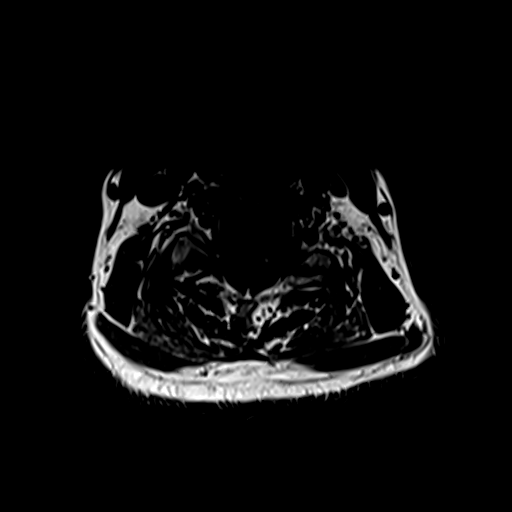
[im 15/27]
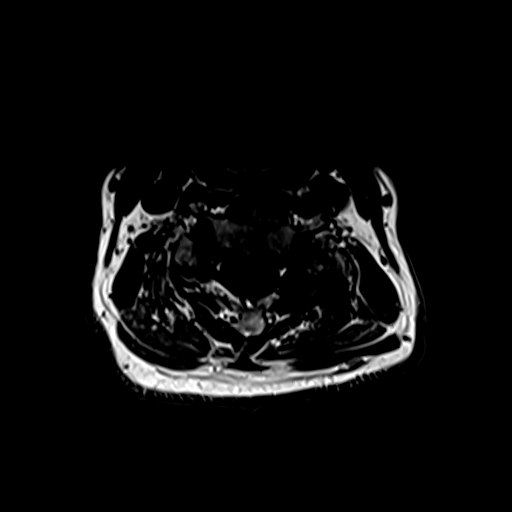
[im 18/27]
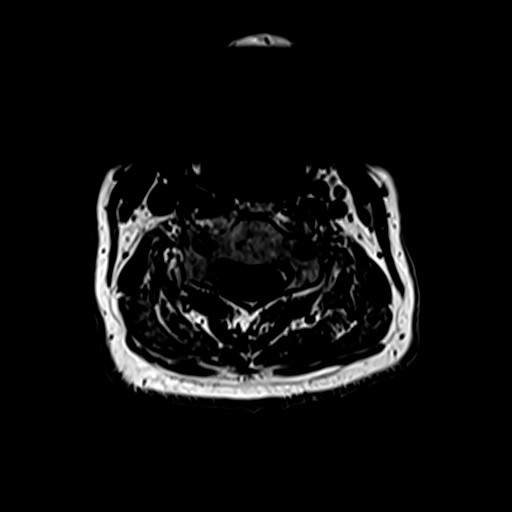
[im 24/27]
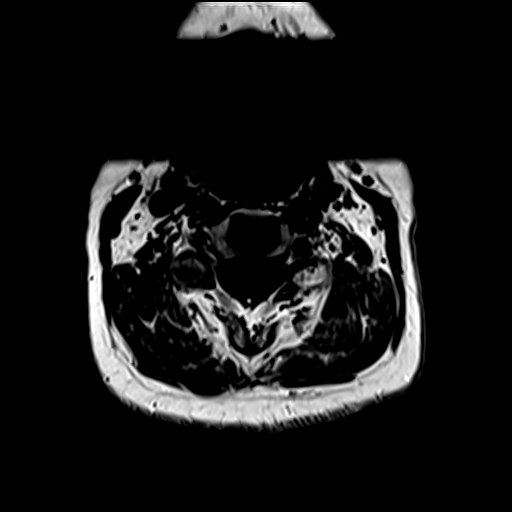
[im 27/27]
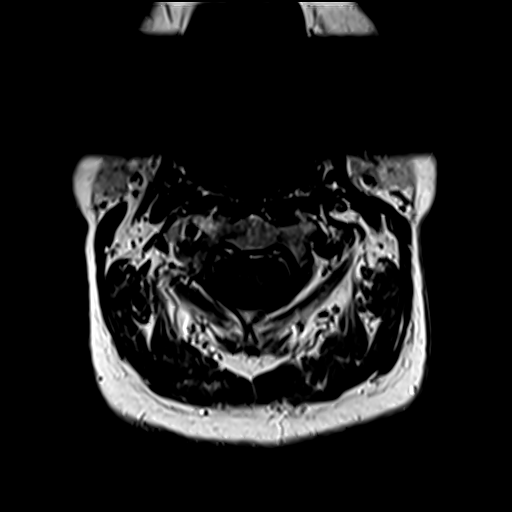

[35 of 48 positions shown; findings below may reference images not displayed]

FINDINGS: Alignment: Reversal of the normal cervical lordosis.

Vertebrae: No acute fracture or suspicious osseous lesion.

Cord: Normal signal and morphology.

Posterior Fossa, vertebral arteries, paraspinal tissues: Negative.

Disc levels:

C2-C3: No significant disc bulge. Left-greater-than-right facet
arthropathy. No spinal canal stenosis. Mild bilateral neural
foraminal narrowing.

C3-C4: No significant disc bulge. Right-greater-than-left facet and
uncovertebral hypertrophy. No spinal canal stenosis. Mild right
neural foraminal narrowing.

C4-C5: Disc height loss with disc osteophyte complex and large
central disc protrusion, which indents and deforms the ventral cord.
Uncovertebral and facet arthropathy. Severe spinal canal stenosis.
Severe bilateral neural foraminal narrowing.

C5-C6: Disc height loss with left-greater-than-right disc osteophyte
complex and uncovertebral hypertrophy. Right-greater-than-left facet
arthropathy. Disc osteophyte complex deforms the spinal cord. Severe
spinal canal stenosis. Severe left and mild right neural foraminal
narrowing.

C6-C7: Mild disc bulge. Right-greater-than-left facet and
uncovertebral hypertrophy. No spinal canal stenosis or neural
foraminal narrowing.

C7-T1: No significant disc bulge. Left-greater-than-right
uncovertebral hypertrophy. No spinal canal stenosis. Mild left
neural foraminal narrowing.
IMPRESSION: 1. C4-C5 severe spinal canal stenosis and severe bilateral neural
foraminal narrowing.
2. C5-C6 severe spinal canal stenosis with severe left and mild
right neural foraminal narrowing.
3. Additional mild neural foraminal narrowing bilaterally at C2-C3,
on the right at C3-C4, and on the left at C7-T1.

## 2021-06-22 IMAGING — CT CT HEAD W/O CM
3 series · 16 of 47 positions shown, 19 images · non-contrast
Comparison: None.

CLINICAL DATA: Acute stroke suspected. Right leg pain 3 weeks after
climbing ladder. Decreased strength left arm 2 weeks.



[Series 2: head wo · axial · 0.41mm/px · z∈[+1535,+1680]mm · 10 of 35 slices shown, 13 images]
[im 3/35  brain]
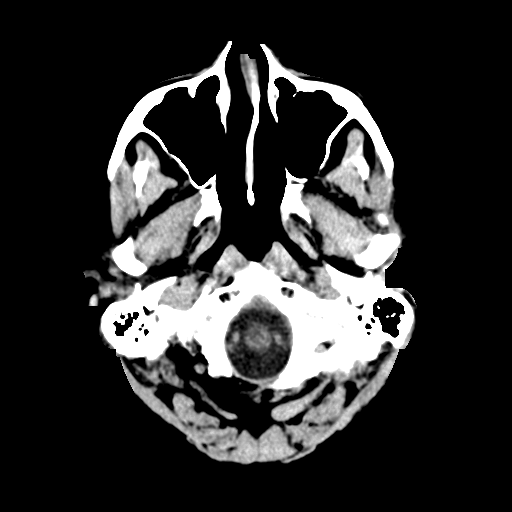
[im 3/35  bone]
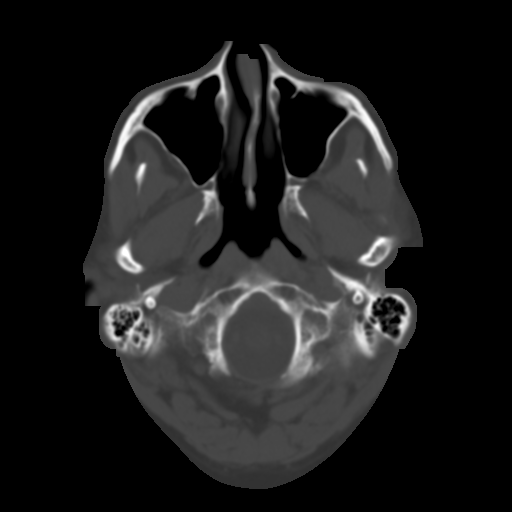
[im 6/35  brain]
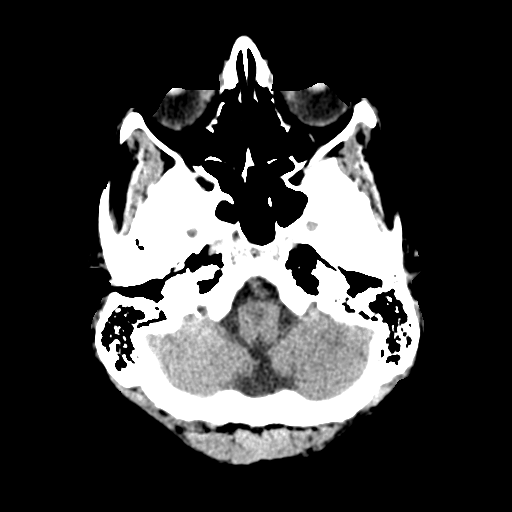
[im 10/35  brain]
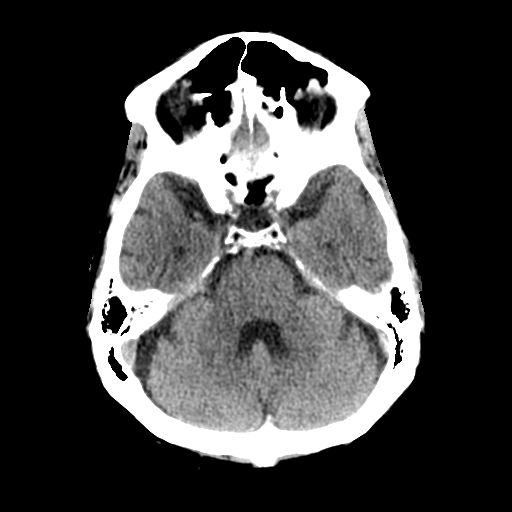
[im 12/35  brain]
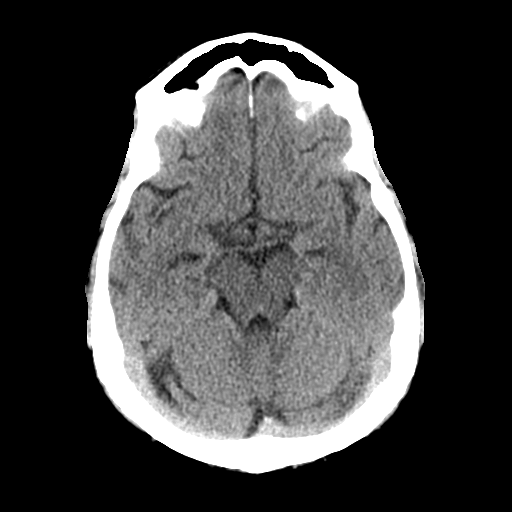
[im 16/35  brain]
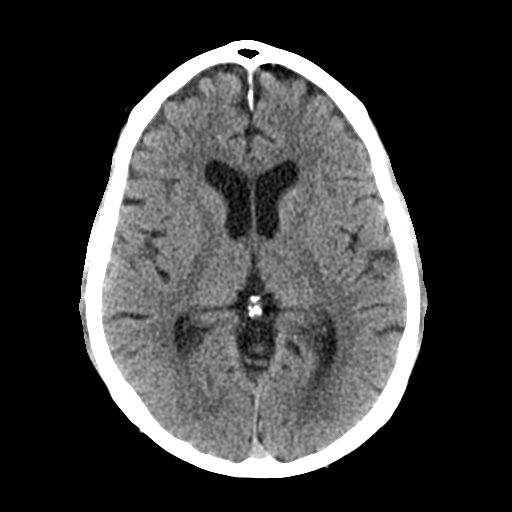
[im 16/35  bone]
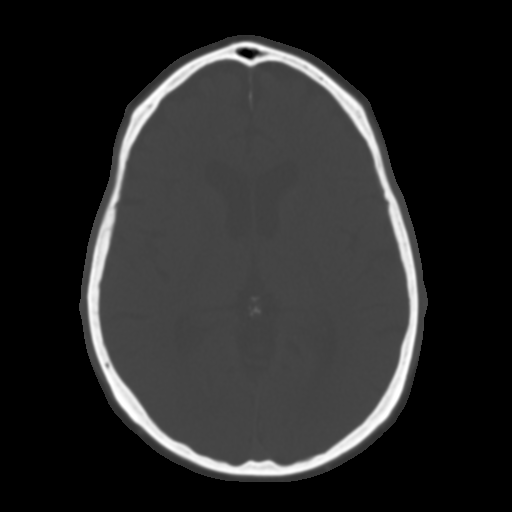
[im 19/35  brain]
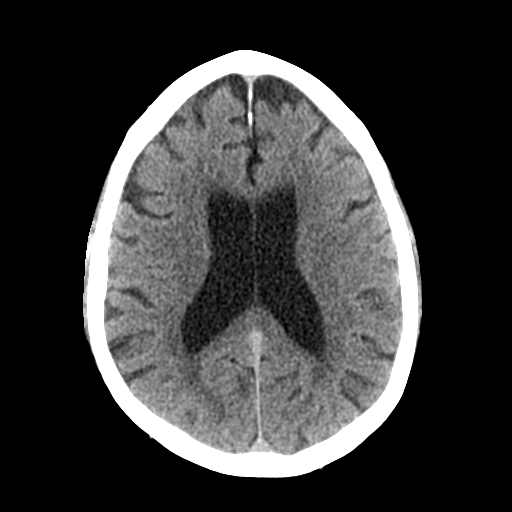
[im 23/35  brain]
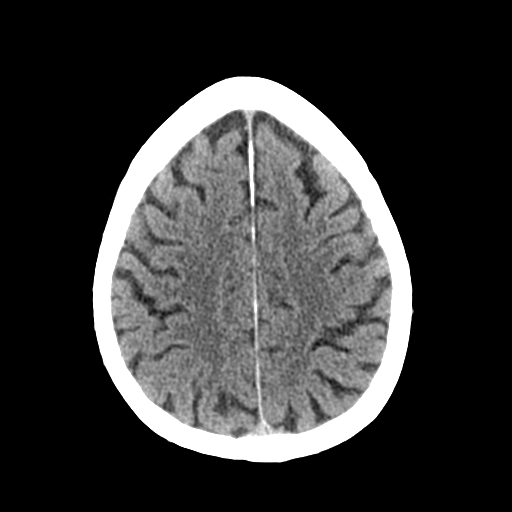
[im 26/35  brain]
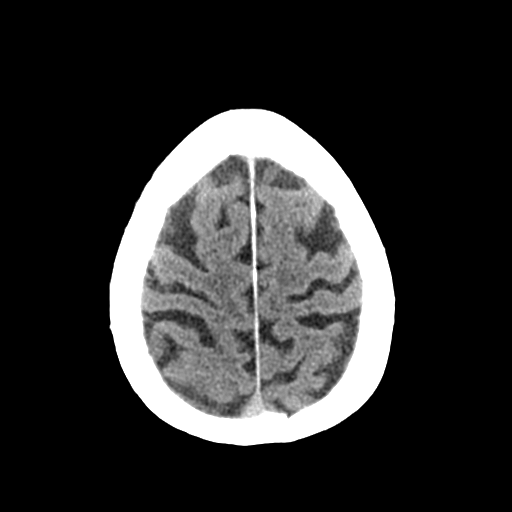
[im 29/35  brain]
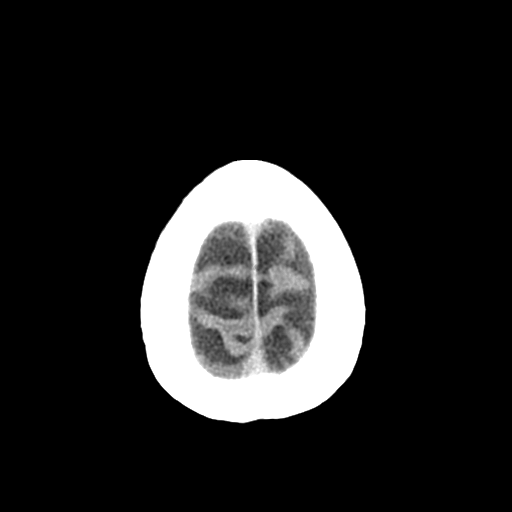
[im 29/35  bone]
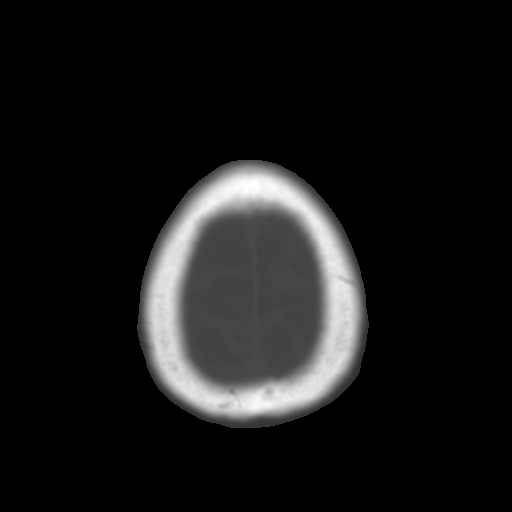
[im 32/35  brain]
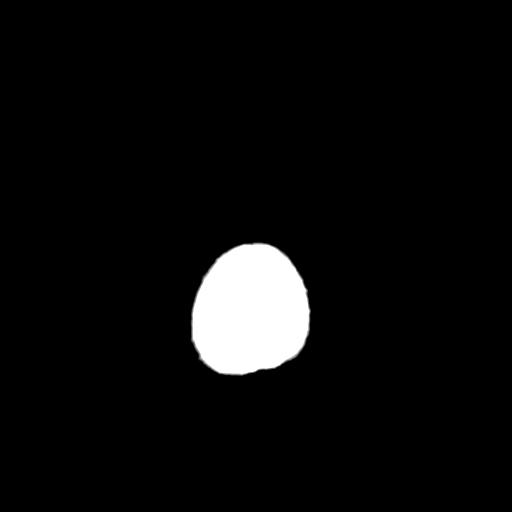

[Series 5: coronal soft tissue · coronal · 0.32mm/px · 3 of 64 slices shown]
[im 22/64  brain]
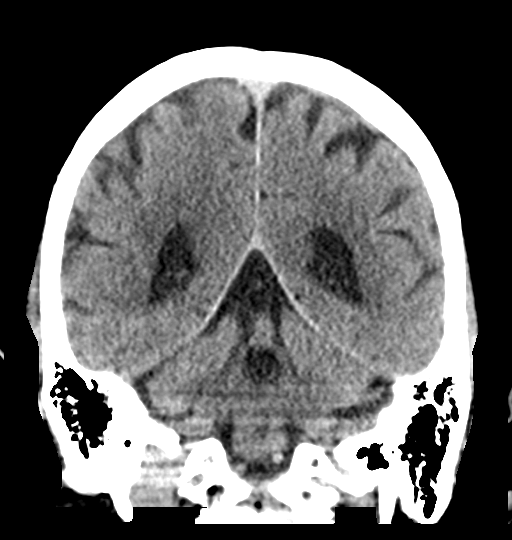
[im 29/64  brain]
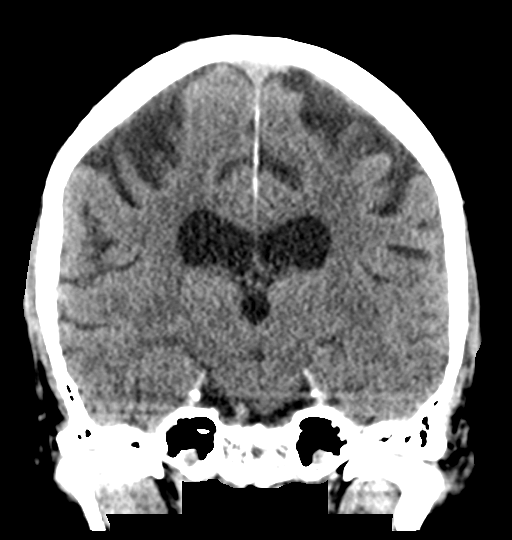
[im 36/64  brain]
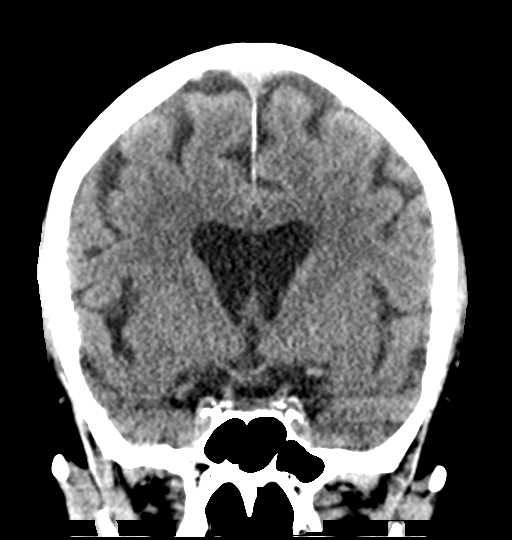

[Series 6: sagittal soft tissue · sagittal · 0.33mm/px · 3 of 55 slices shown]
[im 19/55  brain]
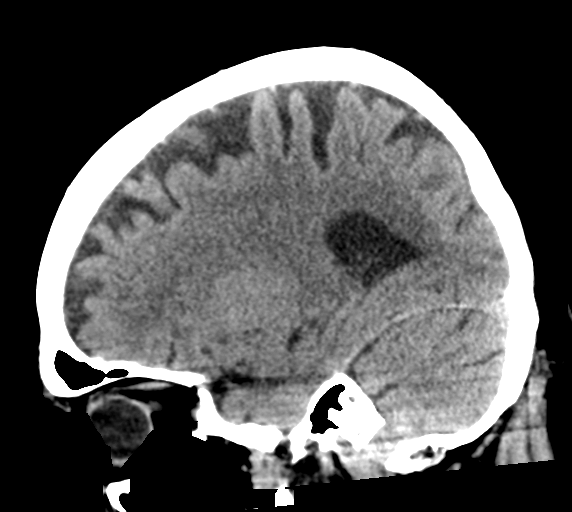
[im 28/55  brain]
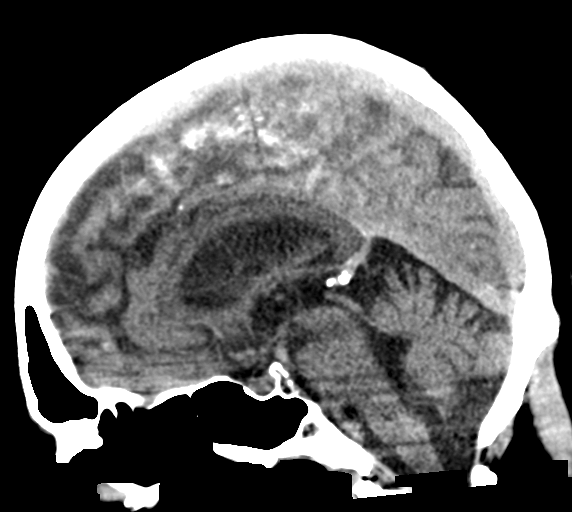
[im 37/55  brain]
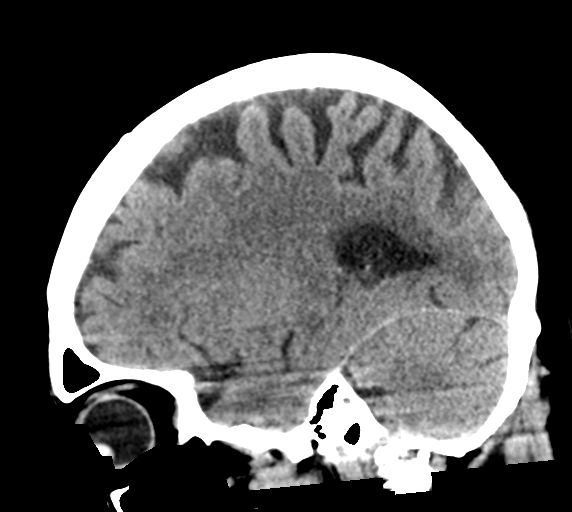

[16 of 47 positions shown; findings below may reference images not displayed]

FINDINGS: Brain: No evidence of acute infarction, hemorrhage, hydrocephalus,
extra-axial collection or mass lesion/mass effect.

Vascular: No hyperdense vessel or unexpected calcification.

Skull: Normal. Negative for fracture or focal lesion.

Sinuses/Orbits: No acute finding.

Other: None.
IMPRESSION: No acute findings.

## 2021-06-22 MED ORDER — LACTATED RINGERS IV BOLUS
1000.0000 mL | Freq: Once | INTRAVENOUS | Status: AC
  Administered 2021-06-22: 1000 mL via INTRAVENOUS

## 2021-06-22 NOTE — ED Triage Notes (Signed)
Patient c/o R leg pain x3 weeks after climbing ladder getting leaves off roof. C/o decreased ROM and strength in L arm x2 weeks. ?

## 2021-06-22 NOTE — Discharge Instructions (Signed)
You have narrowing of your cervical spine which could be the cause of your symptoms.  There are also findings on your MRI of your brain which need to be evaluated by a neurologist.  Please call the attached consultants for follow-up ?

## 2021-06-22 NOTE — ED Provider Notes (Signed)
Patient signed to me by Dr. Gwenlyn Fudge.  Results MRI of brain and cervical spine.  Patient has evidence of cervical stenosis on his C-spine MRI.  Brain MRI showed some nonspecific periventricular and subcortical hypodensities.  Patient able to ambulate without difficulty.  Plan will be to give referral to neurology as well as neurosurgery. ?  ?Lorre Nick, MD ?06/22/21 1726 ? ?

## 2021-06-22 NOTE — ED Provider Notes (Signed)
?[facility]-EMERGENCY DEPT ?Provider Note ? ? ?CSN: [id number] ?Arrival date & time: 06/22/21  1210 ? ?  ? ?History ? ?Chief Complaint  ?Patient presents with  ? Leg Pain  ? Arm Pain  ? ? ?Maxwell Thomas is a 76 y.o. male. ? ?HPI ?76 year old male with a history of hypertension and hyperlipidemia as well as prediabetes presents with right leg and left arm weakness.  Symptoms originally started about 3 weekends ago when he was helping a friend get leaves off of his house.  He went up and down a ladder multiple times.  Next day he seemed to notice some right leg weakness, and is seem like his lower leg would give out on him from time to time.  Over the last 2 weeks he has noticed left arm weakness and notices it when he tries to put shaving cream on the right side of his face he has a hard time reaching that far.  His arm feels weak.  There is no numbness in any extremity.  He is noticed a little bit of a mild headache that has not required medicines on and off over the last few days.  Otherwise no recent illness such as fevers, cough, GI symptoms. ? 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  He reports it was not melanoma. ? ?Home Medications ?Prior to Admission medications   ?Medication Sig Start Date End Date Taking? Authorizing Provider  ?hydrochlorothiazide (HYDRODIURIL) 25 MG tablet Take 25 mg by mouth daily.    [provider]  ?metoprolol succinate (TOPROL-XL) 50 MG 24 hr tablet Take 50 mg by mouth daily. Take with or immediately following a meal.    [provider]  ?pantoprazole (PROTONIX) 40 MG tablet Take 1 tablet (40 mg total) by mouth daily. 05/10/15   Tilden Fossaees, Elizabeth, MD  ?rosuvastatin (CRESTOR) 10 MG tablet Take 10 mg by mouth daily.    [provider]  ?traMADol (ULTRAM) 50 MG tablet Take 1 tablet (50 mg total) by mouth every 6 (six) hours as needed. 06/19/17    Janne NapoleonNeese, Hope M, NP  ?   ? ?Allergies    ?Patient has no known allergies.   ? ?Review of Systems   ?Review of Systems  ?Constitutional:  Negative for fever.  ?Respiratory:  Negative for cough and shortness of breath.   ?Cardiovascular:  Negative for chest pain.  ?Gastrointestinal:  Negative for diarrhea and vomiting.  ?Musculoskeletal:  Negative for back pain and neck pain.  ?Neurological:  Positive for weakness and headaches. Negative for dizziness and numbness.  ? ?Physical Exam ?Updated Vital Signs ?BP (!) 151/91   Pulse 96   Temp 98 ?F (36.7 ?C) (Oral)   Resp 18   SpO2 98%  ?Physical Exam ?Vitals and nursing note reviewed.  ?Constitutional:   ?   Appearance: He is well-developed.  ?HENT:  ?   Head: Normocephalic and atraumatic.  ?Cardiovascular:  ?   Rate and Rhythm: Regular rhythm. Tachycardia present.  ?   Pulses:     ?     Radial pulses are 2+ on the right side and 2+ on the left side.  ?     Dorsalis pedis pulses are 2+ on the right side and 2+ on the left side.  ?   Heart sounds: Normal heart sounds.  ?Pulmonary:  ?   Effort: Pulmonary effort is normal.  ?  Breath sounds: Normal breath sounds.  ?Abdominal:  ?   General: There is no distension.  ?Musculoskeletal:  ?   Comments: No tenderness or decreased range of motion in the left shoulder or right lower extremity  ?Skin: ?   General: Skin is warm and dry.  ?Neurological:  ?   Mental Status: He is alert.  ?   Comments: No apparent facial droop or slurred speech.  Patient has normal strength in the right upper and left lower extremities.  Mild weakness appreciated in the left upper extremity diffusely on multiple testings.  Grossly normal sensation.  Right lower extremity seems slightly weak compared to left but has essentially 5/5 strength.  ? ? ?ED Results / Procedures / Treatments   ?Labs ?(all labs ordered are listed, but only abnormal results are displayed) ?Labs Reviewed  ?COMPREHENSIVE METABOLIC PANEL - Abnormal; Notable for the following  components:  ?    Result Value  ? Glucose, Bld 152 (*)   ? All other components within normal limits  ?CBG MONITORING, ED - Abnormal; Notable for the following components:  ? Glucose-Capillary 137 (*)   ? All other components within normal limits  ?CBC WITH DIFFERENTIAL/PLATELET  ?URINALYSIS, ROUTINE W REFLEX MICROSCOPIC  ?RAPID URINE DRUG SCREEN, HOSP PERFORMED  ?CK  ? ? ?EKG ?EKG Interpretation ? ?Date/Time:  Friday June 22 2021 12:55:41 EDT ?Ventricular Rate:  100 ?PR Interval:  120 ?QRS Duration: 102 ?QT Interval:  381 ?QTC Calculation: 492 ?R Axis:   80 ?Text Interpretation: Sinus tachycardia Borderline T abnormalities, inferior leads Borderline prolonged QT interval No old tracing to compare Confirmed by Pricilla Loveless [phone]) on 06/22/2021 12:58:38 PM ? ?Radiology ?CT Head Wo Contrast ? ?Result Date: 06/22/2021 ?CLINICAL DATA:  Acute stroke suspected. Right leg pain 3 weeks after climbing ladder. Decreased strength left arm 2 weeks. EXAM: CT HEAD WITHOUT CONTRAST TECHNIQUE: Contiguous axial images were obtained from the base of the skull through the vertex without intravenous contrast. RADIATION DOSE REDUCTION: This exam was performed according to the departmental dose-optimization program which includes automated exposure control, adjustment of the mA and/or kV according to patient size and/or use of iterative reconstruction technique. COMPARISON:  None. FINDINGS: Brain: No evidence of acute infarction, hemorrhage, hydrocephalus, extra-axial collection or mass lesion/mass effect. Vascular: No hyperdense vessel or unexpected calcification. Skull: Normal. Negative for fracture or focal lesion. Sinuses/Orbits: No acute finding. Other: None. IMPRESSION: No acute findings. Electronically Signed   By: Elberta Fortis M.D.   On: 06/22/2021 13:14  ? ?MR Cervical Spine Wo Contrast ? ?Result Date: 06/22/2021 ?CLINICAL DATA:  Myelopathy, acute EXAM: MRI CERVICAL SPINE WITHOUT CONTRAST TECHNIQUE: Multiplanar, multisequence MR  imaging of the cervical spine was performed. No intravenous contrast was administered. COMPARISON:  None. FINDINGS: Alignment: Reversal of the normal cervical lordosis. Vertebrae: No acute fracture or suspicious osseous lesion. Cord: Normal signal and morphology. Posterior Fossa, vertebral arteries, paraspinal tissues: Negative. Disc levels: C2-C3: No significant disc bulge. Left-greater-than-right facet arthropathy. No spinal canal stenosis. Mild bilateral neural foraminal narrowing. C3-C4: No significant disc bulge. Right-greater-than-left facet and uncovertebral hypertrophy. No spinal canal stenosis. Mild right neural foraminal narrowing. C4-C5: Disc height loss with disc osteophyte complex and large central disc protrusion, which indents and deforms the ventral cord. Uncovertebral and facet arthropathy. Severe spinal canal stenosis. Severe bilateral neural foraminal narrowing. C5-C6: Disc height loss with left-greater-than-right disc osteophyte complex and uncovertebral hypertrophy. Right-greater-than-left facet arthropathy. Disc osteophyte complex deforms the spinal cord. Severe spinal canal stenosis. Severe left and mild  right neural foraminal narrowing. C6-C7: Mild disc bulge. Right-greater-than-left facet and uncovertebral hypertrophy. No spinal canal stenosis or neural foraminal narrowing. C7-T1: No significant disc bulge. Left-greater-than-right uncovertebral hypertrophy. No spinal canal stenosis. Mild left neural foraminal narrowing. IMPRESSION: 1. C4-C5 severe spinal canal stenosis and severe bilateral neural foraminal narrowing. 2. C5-C6 severe spinal canal stenosis with severe left and mild right neural foraminal narrowing. 3. Additional mild neural foraminal narrowing bilaterally at C2-C3, on the right at C3-C4, and on the left at C7-T1. Electronically Signed   By: Wiliam Ke M.D.   On: 06/22/2021 16:42   ? ?Procedures ?Procedures  ? ? ?Medications Ordered in ED ?Medications  ?lactated ringers  bolus 1,000 mL (1,000 mLs Intravenous Bolus 06/22/21 1330)  ? ? ?ED Course/ Medical Decision Making/ A&P ?  ?                        ?Medical Decision Making ?Amount and/or Complexity of Data Reviewed ?Labs: ordered. ?Radi

## 2021-06-22 NOTE — ED Notes (Signed)
Patient transported to MRI via wheelchair.

## 2021-07-02 ENCOUNTER — Encounter: Payer: Self-pay | Admitting: Neurology

## 2021-07-02 ENCOUNTER — Ambulatory Visit: Payer: Medicare Other | Admitting: Neurology

## 2021-07-02 VITALS — Ht 69.0 in | Wt 165.0 lb

## 2021-07-02 DIAGNOSIS — M5412 Radiculopathy, cervical region: Secondary | ICD-10-CM

## 2021-07-02 DIAGNOSIS — M4802 Spinal stenosis, cervical region: Secondary | ICD-10-CM | POA: Diagnosis not present

## 2021-07-02 DIAGNOSIS — R29898 Other symptoms and signs involving the musculoskeletal system: Secondary | ICD-10-CM | POA: Diagnosis not present

## 2021-07-02 DIAGNOSIS — G959 Disease of spinal cord, unspecified: Secondary | ICD-10-CM | POA: Diagnosis not present

## 2021-07-02 NOTE — Progress Notes (Signed)
? ?[facility] ? ?PATIENT: Maxwell Thomas ?DOB: 1945/08/04 ? ?REQUESTING CLINICIAN: Lorre NickAllen, Anthony, MD ?HISTORY FROM: Patient  ?REASON FOR VISIT: Left arm weakness  ? ? ?HISTORICAL ? ?CHIEF COMPLAINT:  ?Chief Complaint  ?Patient presents with  ? New Patient (Initial Visit)  ?  Room 13, alone  ?NP/internal ED referral for right leg pain, left arm weakness, abnormal MRI ? ?States right leg pain has subsided, left arm weakness has progressed, sx started march 15,2023  ? ? ?HISTORY OF PRESENT ILLNESS:  ?This is a 76 year old gentleman past medical history of hypertension, hyperlipidemia, diabetes mellitus who is presenting with complaint of left arm weakness and right leg weakness.  Patient reports that his symptoms started on March 15 after working on the roof, going up and down a ladder.  Initially started with left arm weakness and right leg weakness, sometimes he will feel that the right right leg will give out.  He started using a cane, the weakness continued for 2 to 3 weeks and improve to the point that he is not using the cane anymore.   ?When he comes to the left arm the symptoms worsen now to the point that he cannot pick up the left arm.  He is very weak at the shoulder level, he still have a good grip but unable to abduct the left arm.  When he presented to the ED 3 weeks after the symptoms started, he did have MRI brain and cervical spine.  The MRI brain did not show any acute abnormality but the MRI cervical spine showed severe canal stenosis at C4 5 and  C5-6.  Patient was referred to neurosurgery, Dr. Lovell SheehanJenkins for which he had an appointment next month on the 17th.  He denies any pain, denies any numbness, no tingling, states that his only complaint is weakness. ? ? ?OTHER MEDICAL CONDITIONS: Hypertension, Hyperlipidemia, Diabetes  ? ? ?REVIEW OF SYSTEMS: Full 14 system review of systems performed and negative with exception of: as noted in the HPI  ? ?ALLERGIES: ?No Known  Allergies ? ?HOME MEDICATIONS: ?Outpatient Medications Prior to Visit  ?Medication Sig Dispense Refill  ? hydrochlorothiazide (HYDRODIURIL) 25 MG tablet Take 25 mg by mouth daily.    ? metoprolol succinate (TOPROL-XL) 50 MG 24 hr tablet Take 50 mg by mouth daily. Take with or immediately following a meal.    ? rosuvastatin (CRESTOR) 10 MG tablet Take 10 mg by mouth daily.    ? pantoprazole (PROTONIX) 40 MG tablet Take 1 tablet (40 mg total) by mouth daily. 20 tablet 0  ? traMADol (ULTRAM) 50 MG tablet Take 1 tablet (50 mg total) by mouth every 6 (six) hours as needed. 10 tablet 0  ? ?No facility-administered medications prior to visit.  ? ? ?PAST MEDICAL HISTORY: ?Past Medical History:  ?Diagnosis Date  ? Hyperlipidemia   ? Hypertension   ? ? ?PAST SURGICAL HISTORY: ?No past surgical history on file. ? ?FAMILY HISTORY: ?Family History  ?Problem Relation Age of Onset  ? Alzheimer's disease Mother   ? Heart attack Father   ? Parkinson's disease Brother   ? ? ?SOCIAL HISTORY: ?Social History  ? ?Socioeconomic History  ? Marital status: Widowed  ?  Spouse name: Not on file  ? Number of children: Not on file  ? Years of education: Not on file  ? Highest education level: Not on file  ?Occupational History  ? Not on file  ?Tobacco Use  ? Smoking status: Never  ? Smokeless tobacco:  Not on file  ?Substance and Sexual Activity  ? Alcohol use: No  ?  Alcohol/week: 0.0 standard drinks  ? Drug use: No  ? Sexual activity: Not on file  ?Other Topics Concern  ? Not on file  ?Social History Narrative  ? Not on file  ? ?Social Determinants of Health  ? ?Financial Resource Strain: Not on file  ?Food Insecurity: Not on file  ?Transportation Needs: Not on file  ?Physical Activity: Not on file  ?Stress: Not on file  ?Social Connections: Not on file  ?Intimate Partner Violence: Not on file  ? ? ?PHYSICAL EXAM ? ?GENERAL EXAM/CONSTITUTIONAL: ?Vitals:  ?Vitals:  ? 07/02/21 0833  ?Weight: 165 lb (74.8 kg)  ?Height: 5\' 9"  (1.753 m)  ? ?Body  mass index is 24.37 kg/m?. ?Wt Readings from Last 3 Encounters:  ?07/02/21 165 lb (74.8 kg)  ?04/12/15 180 lb 3.2 oz (81.7 kg)  ? ?Patient is in no distress; well developed, nourished and groomed; neck is supple ? ?EYES: ?Pupils round and reactive to light, Visual fields full to confrontation, Extraocular movements intacts,  ? ?MUSCULOSKELETAL: ?Gait, strength, tone, movements noted in Neurologic exam below ? ?NEUROLOGIC: ?MENTAL STATUS:  ?   ? View : No data to display.  ?  ?  ?  ? ?awake, alert, oriented to person, place and time ?recent and remote memory intact ?normal attention and concentration ?language fluent, comprehension intact, naming intact ?fund of knowledge appropriate ? ?CRANIAL NERVE:  ?2nd, 3rd, 4th, 6th - pupils equal and reactive to light, visual fields full to confrontation, extraocular muscles intact, no nystagmus ?5th - facial sensation symmetric ?7th - facial strength symmetric ?8th - hearing intact ?9th - palate elevates symmetrically, uvula midline ?11th - shoulder shrug symmetric ?12th - tongue protrusion midline ? ?MOTOR:  ?normal bulk and tone, full strength in the RUE, BLE. Left shoulder abduction 2/5, shoulder flexion/extension 2/5. Left elbow flexion/extension 5/5 and left wrist flexion/extension 5/5. Finger intrinsics 5/5 ? ?SENSORY:  ?normal and symmetric to light touch, pinprick, temperature, vibration ? ?COORDINATION:  ?finger-nose-finger, fine finger movements normal ? ?REFLEXES:  ?deep tendon reflexes present and symmetric ? ?GAIT/STATION:  ?normal ? ? ? ?DIAGNOSTIC DATA (LABS, IMAGING, TESTING) ?- I reviewed patient records, labs, notes, testing and imaging myself where available. ? ?Lab Results  ?Component Value Date  ? WBC 8.2 06/22/2021  ? HGB 15.9 06/22/2021  ? HCT 45.0 06/22/2021  ? MCV 89.6 06/22/2021  ? PLT 219 06/22/2021  ? ?   ?Component Value Date/Time  ? NA 139 06/22/2021 1246  ? K 4.8 06/22/2021 1246  ? CL 104 06/22/2021 1246  ? CO2 27 06/22/2021 1246  ? GLUCOSE 152  (H) 06/22/2021 1246  ? BUN 17 06/22/2021 1246  ? CREATININE 0.87 06/22/2021 1246  ? CREATININE 1.06 04/12/2015 1257  ? CALCIUM 9.2 06/22/2021 1246  ? PROT 8.0 06/22/2021 1246  ? ALBUMIN 4.7 06/22/2021 1246  ? AST 25 06/22/2021 1246  ? ALT 23 06/22/2021 1246  ? ALKPHOS 58 06/22/2021 1246  ? BILITOT 1.1 06/22/2021 1246  ? GFRNONAA >60 06/22/2021 1246  ? GFRAA >60 05/10/2015 1536  ? ?No results found for: CHOL, HDL, LDLCALC, LDLDIRECT, TRIG, CHOLHDL ?No results found for: HGBA1C ?No results found for: VITAMINB12 ?No results found for: TSH ? ? ?MRI Brain 06/22/2021 ?1. No acute intracranial abnormality. ?2. Periventricular and subcortical T2 hyperintensities bilaterally are mildly advanced for age. The finding is nonspecific but can be seen in the setting of chronic microvascular ischemia, a  demyelinating process such as multiple sclerosis, vasculitis, complicated migraine headaches, or as the sequelae of a prior infectious or inflammatory process. ?3.  Cerebral Atrophy (ICD10-G31.9). ? ? ?MRI Cervical spine 06/22/2021 ?1. C4-C5 severe spinal canal stenosis and severe bilateral neural foraminal narrowing. ?2. C5-C6 severe spinal canal stenosis with severe left and mild right neural foraminal narrowing. ?3. Additional mild neural foraminal narrowing bilaterally at C2-C3, on the right at C3-C4, and on the left at C7-T1. ? ? ? ?ASSESSMENT AND PLAN ? ?76 y.o. year old male with hypertension, hyperlipidemia, diabetes mellitus who is presenting with left arm weakness and right leg weakness for the past month.  Right leg weakness improved but left arm weakness is worsening.  His proximal muscle strength is 2/5, (shoulder abduction, shoulder flexion, extension) patient does have evidence of cervical myelopathy most likely explaining his symptoms. He denies any pain. He does have an appointment with neurosurgeon next month, advised him to contact the office to see if he can be seen sooner.  In the meantime, I will send him to  physical therapy to improve his strength.  I will also order an EMG nerve conduction study to look at the integrity of the nerve of the left arm.  I will see him in 6 months for follow-up ? ? ?1. Cervical myelopathy (HC

## 2021-07-02 NOTE — Patient Instructions (Addendum)
Follow u with Dr. Albertina Parr as scheduled, please make sure to bring copy of previous MRIs  ?Referral to physical therapy regarding the left arm weakness ?Referral for EMG/NCS with Dr. Ethelene Hal  ?Follow up in 6 months  ?

## 2021-07-11 ENCOUNTER — Ambulatory Visit: Payer: Medicare Other | Admitting: Neurology

## 2021-07-11 ENCOUNTER — Ambulatory Visit (INDEPENDENT_AMBULATORY_CARE_PROVIDER_SITE_OTHER): Payer: Medicare Other | Admitting: Neurology

## 2021-07-11 DIAGNOSIS — G959 Disease of spinal cord, unspecified: Secondary | ICD-10-CM | POA: Diagnosis not present

## 2021-07-11 DIAGNOSIS — R29898 Other symptoms and signs involving the musculoskeletal system: Secondary | ICD-10-CM | POA: Diagnosis not present

## 2021-07-11 DIAGNOSIS — R269 Unspecified abnormalities of gait and mobility: Secondary | ICD-10-CM | POA: Diagnosis not present

## 2021-07-11 DIAGNOSIS — M5412 Radiculopathy, cervical region: Secondary | ICD-10-CM | POA: Diagnosis not present

## 2021-07-11 DIAGNOSIS — M4802 Spinal stenosis, cervical region: Secondary | ICD-10-CM | POA: Diagnosis not present

## 2021-07-11 NOTE — Procedures (Signed)
? ? ? ?   ?Full Name: Chrissie NoaWilliam Cottrill Gender: Male ?MRN #: [id number] Date of Birth: 08-19-1945 ?   ?Visit Date: 07/11/2021 08:06 ?Age: [age] Years ?Examining Physician: Levert FeinsteinYan, Saagar Tortorella ?Referring Physician: Windell Norfolkamara, Amadou ?Height: 5 feet 10 inch ?History: 76 year old male with subacute onset profound left arm weakness, mild right leg weakness 6 weeks ago.  MRI of cervical spine showed severe spinal stenosis at C4-5, C5-6, with moderate to severe foraminal narrowing ? ?Summary of the test: ? ?Nerve conduction study: ? ?Right sural, superficial peroneal sensory response showed mildly decreased snap amplitude. ? ?Left median sensory response showed mildly prolonged peak latency was normal snap amplitude. ? ?Bilateral ulnar sensory responses were within normal limit. ? ?Left median, bilateral ulnar, and right peroneal, tibial motor responses were within normal limit. ? ?Right median motor response showed mildly prolonged distal latency, with normal CMAP amplitude, conduction velocity. ? ?Electromyography: ?Selected needle examinations were performed at bilateral lower extremity muscles, bilateral lumbosacral paraspinal muscles, right upper extremity muscles, and right cervical paraspinal muscles. ? ?There is evidence of active neuropathic changes involving left upper extremity, mostly left C5, 6, more than 7 and 8 dermatomes. ? ?There is also evidence of mild chronic neuropathic changes involving right C5-6 dermatomes. ? ?There is evidence of increased cervical paraspinal muscle insertion activities, most obvious at the left lower cervical paraspinal muscles. ? ?There is chronic neuropathic changes involving bilateral L4-5 S1, right worse than left, ? ?There is also evidence of mildly increased insertional activity at bilateral lower lumbar paraspinal muscles. ? ? ?Conclusion: ?This is an abnormal study.  There is electrodiagnostic evidence of active left cervical radiculopathy, mainly involving left C5, C6 more than C7; mild  chronic right cervical radiculopathy involving C5-6.  There is also evidence of moderate right carpal tunnel syndromes. ? ?There is chronic bilateral lumbosacral radiculopathy, involving L4-5 S1, right worse than left. ? ?There is no evidence of large fiber peripheral neuropathy.  ? ? ? ?Levert FeinsteinYIjun Bryceson Grape M.D. PhD ? ?[facility] ?[address], [address] ?[place], [place] [address] ?Tel: [phone] ?Fax: [phone] ? ?Verbal informed consent was obtained from the patient, patient was informed of potential risk of procedure, including bruising, bleeding, hematoma formation, infection, muscle weakness, muscle pain, numbness, among others. ?   ? ?   ?MNC ?   ?Nerve / Sites Muscle Latency Ref. Amplitude Ref. Rel Amp Segments Distance Velocity Ref. Area  ?  ms ms mV mV %  cm m/s m/s mVms  ?R Median - APB  ?   Wrist APB 4.6 ?4.4 5.1 ?4.0 100 Wrist - APB 7   19.1  ?   Upper arm APB 9.1  4.5  88.5 Upper arm - Wrist 22.5 50 ?49 19.0  ?L Median - APB  ?   Wrist APB 4.0 ?4.4 7.5 ?4.0 100 Wrist - APB 7   26.8  ?   Upper arm APB 8.9  5.6  75.4 Upper arm - Wrist 24 50 ?49 19.4  ?R Ulnar - ADM  ?   Wrist ADM 3.3 ?3.3 11.6 ?6.0 100 Wrist - ADM 7   35.7  ?   B.Elbow ADM 6.3  9.7  83.8 B.Elbow - Wrist 17 57 ?49 31.5  ?   A.Elbow ADM 8.8  10.4  108 A.Elbow - B.Elbow 15 61 ?49 33.2  ?L Ulnar - ADM  ?   Wrist ADM 3.2 ?3.3 8.0 ?6.0 100 Wrist - ADM 7   21.2  ?   B.Elbow ADM 6.5  9.4  117 B.Elbow - Wrist   ?49 24.3  ?   A.Elbow ADM 8.8  8.2  87.8 A.Elbow - B.Elbow   ?49 22.3  ?R Peroneal - EDB  ?   Ankle EDB 5.1 ?6.5 3.3 ?2.0 100 Ankle - EDB 9   11.9  ?   Fib head EDB 13.1  2.8  84.1 Fib head - Ankle 33 41 ?44 11.2  ?   Pop fossa EDB 15.6  2.7  96.7 Pop fossa - Fib head 11 43 ?44 11.1  ?       Pop fossa - Ankle      ?R Tibial - AH  ?   Ankle AH 6.9 ?5.8 6.1 ?4.0 100 Ankle - AH 9   15.8  ?   Pop fossa AH 18.0  4.1  68.2 Pop fossa - Ankle 45 41 ?41 14.2  ?               ?SNC ?   ?Nerve / Sites Rec. Site Peak Lat Ref.  Amp Ref.  Segments Distance  ?  ms ms ?V ?V  cm  ?R Sural - Ankle (Calf)  ?   Calf Ankle 4.0 ?4.4 4 ?6 Calf - Ankle 14  ?R Superficial peroneal - Ankle  ?   Lat leg Ankle 3.3 ?4.4 4 ?6 Lat leg - Ankle 14  ?R Median - Orthodromic (Dig II, Mid palm)  ?   Dig II Wrist 3.6 ?3.4 14 ?10 Dig II - Wrist 13  ?L Median - Orthodromic (Dig II, Mid palm)  ?   Dig II Wrist 3.2 ?3.4 13 ?10 Dig II - Wrist 13  ?R Ulnar - Orthodromic, (Dig V, Mid palm)  ?   Dig V Wrist 2.7 ?3.1 6 ?5 Dig V - Wrist 11  ?L Ulnar - Orthodromic, (Dig V, Mid palm)  ?   Dig V Wrist 2.7 ?3.1 8 ?5 Dig V - Wrist 11  ?               ?F  Wave ?   ?Nerve F Lat Ref.  ? ms ms  ?R Median - APB 33.6 ?31.0  ?R Ulnar - ADM 29.8 ?32.0  ?L Median - APB 30.4 ?31.0  ?L Ulnar - ADM 33.9 ?32.0  ?R Peroneal - EDB 53.8 ?56.0  ?R Tibial - AH 52.3 ?56.0  ?               ?EMG Summary Table   ? Spontaneous MUAP Recruitment  ?Muscle IA Fib PSW Fasc Other Amp Dur. Poly Pattern  ?L. Biceps brachii Increased 2+ 2+ None _______ Decreased Increased 1+ Reduced  ?L. Deltoid Increased 2+ 2+ None _______ Normal Decreased 2+ Reduced  ?L. Triceps brachii Increased None None None _______ Increased Increased 1+ Reduced  ?L. Brachioradialis Increased 1+ None None _______ Increased Increased 1+ Reduced  ?L. Extensor digitorum communis Normal None None None _______ Normal Normal Normal Reduced  ?L. First dorsal interosseous Normal None None None _______ Normal Normal Normal Normal  ?R. First dorsal interosseous Normal None None None _______ Normal Normal Normal Normal  ?R. Pronator teres Normal None None None _______ Normal Normal Normal Normal  ?R. Biceps brachii Increased None None None _______ Normal Normal Normal Reduced  ?R. Deltoid Increased None None None _______ Normal Normal Normal Reduced  ?R. Triceps brachii Normal None None None _______ Normal Normal Normal Normal  ?L. Cervical paraspinals Increased 1+ None None _______ Normal Increased 1+ Normal  ?  R. Cervical paraspinals Increased None None  None _______ Normal Normal Normal Normal  ?R. Tibialis anterior Increased 1+ None None _______ Increased Increased 1+ Reduced  ?R. Tibialis posterior Increased None None None _______ Increased Increased 1+ Reduced  ?R. Peroneus longus Increased None None None _______ Increased Increased 1+ Reduced  ?R. Gastrocnemius (Medial head) Normal None None None _______ Normal Normal Normal Reduced  ?R. Vastus lateralis Increased None None None _______ Increased Increased 1+ Reduced  ?L. Tibialis anterior Normal None None None _______ Normal Normal Normal Reduced  ?L. Tibialis posterior Normal None None None _______ Normal Normal Normal Reduced  ?L. Gastrocnemius (Medial head) Normal None None None _______ Normal Normal Normal Reduced  ?L. Peroneus longus Normal None None None _______ Normal Normal Normal Normal  ?L. Vastus lateralis Normal None None None _______ Normal Normal Normal Normal  ?R. Gluteus medius Normal None None None _______ Increased Increased Normal Reduced  ?R. Biceps femoris (long head) Normal None None None _______ Normal Normal Normal Reduced  ?R. Biceps femoris (short head) Normal None None None _______ Normal Normal Normal Reduced  ?R. Lumbar paraspinals (low) Increased None None None _______ Normal Increased 1+ Normal  ?R. Lumbar paraspinals (mid) Normal None None None _______ Normal Normal Normal Normal  ?L. Lumbar paraspinals (low) Increased None None None _______ Normal Normal Normal Normal  ?L. Lumbar paraspinals (mid) Normal None None None _______ Normal Normal Normal Normal  ? ?  ?

## 2021-07-11 NOTE — Progress Notes (Signed)
? ?ASSESSMENT AND PLAN ? ?Maxwell Thomas is a 76 y.o. male  ?Cervical myelopathy ?Gait abnormality ? Severe spinal stenosis at C4-5, severe bilateral neuroforaminal narrowing, severe canal stenosis at C5-6, and severe left foraminal narrowing, would explain his left upper profound weakness, gait abnormality, left patella hyperreflexia, bilateral Babinski signs.  ?But would not explain his lack of reflex at right patella, apparent asymmetry of right more than left leg weakness, ?In addition, EMG nerve conduction study showed active left cervical radiculopathy, mild chronic lumbosacral radiculopathy. ?We will proceed MRI of lumbar spine ?Pending neurosurgical evaluation on Jul 31, 2021 ? ? ?DIAGNOSTIC DATA (LABS, IMAGING, TESTING) ?- I reviewed patient records, labs, notes, testing and imaging myself where available. ? ? ?MEDICAL HISTORY: ? ?Maxwell Thomas is a 76 year old right-handed male, referred by Dr. Teresa Coombs, for EMG nerve conduction study, evaluation of left arm and leg weakness.  His primary care physician is Dr. Lupe Carney ? ?I reviewed and summarized the referring note.  Past medical history ?Hypertension ?Hyperlipidemia ? 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 ?Few days later, he noticed left arm weakness, right leg weakness, to the point of difficulty walking, has to use the cane for a while, his right leg weakness and gait abnormality mildly improved over the past few days, he can ambulate independent.  But he continues to have profound left arm weakness, he denies significant sensory loss, no significant neck or low back pain ? ?He does have chronic urinary urgency, worsening urinary and bowel urgency to the point of occasionally incontinence over the past few weeks, have to wear depends ? ?He was seen  at emergency room June 22, 2021, and Dr.Camara on July 02, 2021, ? ?Personally reviewed MRI of cervical spine: Severe spinal canal stenosis C4-5, C5-6, severe bilateral foraminal narrowing at C4-5, severe left and mild right foraminal narrowing C5-6 ? ?He has pending neurosurgical evaluation on Jul 31, 2021, ? ?MRI of brain showed mild atrophy, small vessel disease no acute abnormality ? ? ?PHYSICAL EXAM: ?  ? ?PHYSICAL EXAMNIATION: ? ?Gen: NAD, conversant, well nourised, well groomed                     ?Cardiovascular: Regular rate rhythm, no peripheral edema, warm, nontender. ?Eyes: Conjunctivae clear without exudates or hemorrhage ?Neck: Supple, no carotid bruits. ?Pulmonary: Clear to auscultation bilaterally  ? ?NEUROLOGICAL EXAM: ? ?MENTAL STATUS: ?Speech/cognition: ?Awake, alert, oriented to history taking and care conversation ? ?CRANIAL NERVES: ?CN II: Visual fields are full to confrontation. Pupils are round equal and briskly reactive to light. ?CN III, IV, VI: extraocular movement are normal. No ptosis. ?CN V: Facial sensation is intact to light touch ?CN VII: Face is symmetric with normal eye closure  ?CN VIII: Hearing is normal to causal conversation. ?CN IX, X: Phonation is normal. ?CN XI: Head turning and shoulder shrug are intact ? ?MOTOR:  ?UE Shoulder Abduction Shoulder External Rotation Elbow ?Flexion Elbow  ?Extension Pronation Supination Wrist ?Flexion Wrist ?Extension Grip Finger  ?Abduction Finger Flexion ?/Extension  ?R 5 5 5 5 5 5 5 5 5 5  5/5  ?L 3 3 3 4 4 3 5 5 5  5- 5/5  ? ?LE Hip Flexion Knee flexion Knee extension Ankle Dorsiflexion Hip adduction Hip Abduction  ?R  4 5 4+ 4 4  4+  ?L 5 5 5 5 5 5   ?  ? ?REFLEXES: Absent bilateral biceps, brachioradialis reflexes, left triceps, right triceps reflex was present on the right side, absent right patellar reflex, brisk left patellar reflex, nonsustained ankle clonus bilaterally, bilateral Babinski signs ? ?SENSORY: ?Intact to light touch,  pinprick are intact in fingers and toes. ? ?COORDINATION: ?There is no trunk or limb dysmetria noted. ? ?GAIT/STANCE: need to push up, mildly unsteady, difficulty standing on tiptoe and heels. ? ?REVIEW OF SYSTEMS:  ?Full 14 system review of systems performed and notable only for as above ?All other review of systems were negative. ? ? ?ALLERGIES: ?No Known Allergies ? ?HOME MEDICATIONS: ?Current Outpatient Medications  ?Medication Sig Dispense Refill  ? hydrochlorothiazide (HYDRODIURIL) 25 MG tablet Take 25 mg by mouth daily.    ? metoprolol succinate (TOPROL-XL) 50 MG 24 hr tablet Take 50 mg by mouth daily. Take with or immediately following a meal.    ? rosuvastatin (CRESTOR) 10 MG tablet Take 10 mg by mouth daily.    ? ?No current facility-administered medications for this visit.  ? ? ?PAST MEDICAL HISTORY: ?Past Medical History:  ?Diagnosis Date  ? Hyperlipidemia   ? Hypertension   ? ? ?PAST SURGICAL HISTORY: ?No past surgical history on file. ? ?FAMILY HISTORY: ?Family History  ?Problem Relation Age of Onset  ? Alzheimer's disease Mother   ? Heart attack Father   ? Parkinson's disease Brother   ? ? ?SOCIAL HISTORY: ?Social History  ? ?Socioeconomic History  ? Marital status: Widowed  ?  Spouse name: Not on file  ? Number of children: Not on file  ? Years of education: Not on file  ? Highest education level: Not on file  ?Occupational History  ? Not on file  ?Tobacco Use  ? Smoking status: Never  ? Smokeless tobacco: Not on file  ?Substance and Sexual Activity  ? Alcohol use: No  ?  Alcohol/week: 0.0 standard drinks  ? Drug use: No  ? Sexual activity: Not on file  ?Other Topics Concern  ? Not on file  ?Social History Narrative  ? Not on file  ? ?Social Determinants of Health  ? ?Financial Resource Strain: Not on file  ?Food Insecurity: Not on file  ?Transportation Needs: Not on file  ?Physical Activity: Not on file  ?Stress: Not on file  ?Social Connections: Not on file  ?Intimate Partner Violence: Not on file   ? ? ? ? ? , M.D. Ph.D. ? ?[facility] ?[address], [address] ?[place], [place] ?Ph: [phone]) [phone] ?Fax: [phone] ? ?CC:  Mitchell, [phone], MD ?[address] ?[address] ?[place],  [place]  [address], L.Clovis Riley, MD   ?

## 2021-07-12 ENCOUNTER — Telehealth: Payer: Self-pay | Admitting: Neurology

## 2021-07-12 NOTE — Telephone Encounter (Signed)
[employer] medicare auth pending sent notes. Will be sent to [facility] once approved. ?

## 2021-07-12 NOTE — Telephone Encounter (Signed)
[employer] medicare Berkley Harvey: [id number] (exp. 07/12/21 to 08/10/21) order sent to [facility], they will reach out to the patient to schedule.  ?

## 2021-07-17 ENCOUNTER — Telehealth: Payer: Self-pay | Admitting: Neurology

## 2021-07-17 ENCOUNTER — Ambulatory Visit
Admission: RE | Admit: 2021-07-17 | Discharge: 2021-07-17 | Disposition: A | Discharge status: Home / Self Care | Payer: Medicare Other | Source: Ambulatory Visit | Attending: Neurology | Admitting: Neurology

## 2021-07-17 DIAGNOSIS — R270 Ataxia, unspecified: Secondary | ICD-10-CM | POA: Diagnosis not present

## 2021-07-17 DIAGNOSIS — M47816 Spondylosis without myelopathy or radiculopathy, lumbar region: Secondary | ICD-10-CM | POA: Diagnosis not present

## 2021-07-17 DIAGNOSIS — R269 Unspecified abnormalities of gait and mobility: Secondary | ICD-10-CM

## 2021-07-17 DIAGNOSIS — G959 Disease of spinal cord, unspecified: Secondary | ICD-10-CM

## 2021-07-17 DIAGNOSIS — M48061 Spinal stenosis, lumbar region without neurogenic claudication: Secondary | ICD-10-CM | POA: Diagnosis not present

## 2021-07-17 DIAGNOSIS — R29898 Other symptoms and signs involving the musculoskeletal system: Secondary | ICD-10-CM

## 2021-07-17 IMAGING — MR MR LUMBAR SPINE W/O CM
5 series · 32 of 48 positions shown · non-contrast
Comparison: None Available.

CLINICAL DATA: Ataxia, left arm weakness

EXAM:
MRI LUMBAR SPINE WITHOUT CONTRAST
TECHNIQUE: Multiplanar, multisequence MR imaging of the lumbar spine was
performed. No intravenous contrast was administered.

[Series 2: T2 · sagittal · 4.0mm · 1.09mm/px · 6 of 17 slices shown (1 of 2)]
[im 1/17]
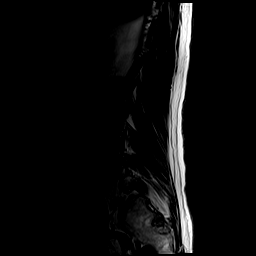
[im 4/17]
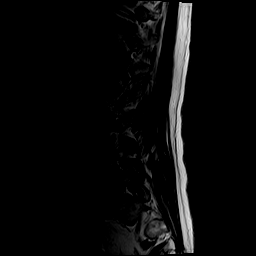
[im 7/17]
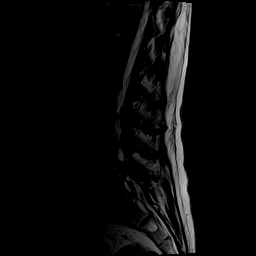
[im 10/17]
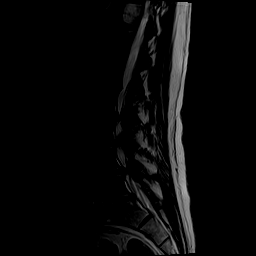
[im 13/17]
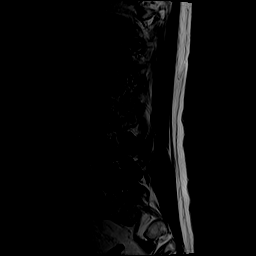
[im 17/17]
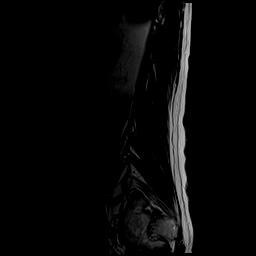

[Series 3: STIR · sagittal · 4.0mm · 1.09mm/px · 2 of 17 slices shown]
[im 1/17]
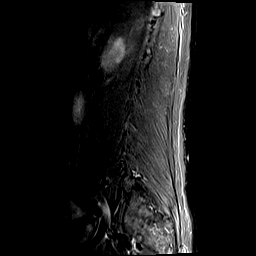
[im 4/17]
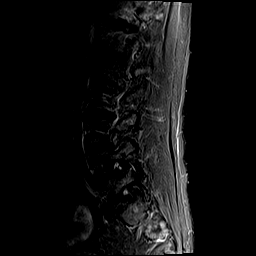

[Series 4: T1 · sagittal · 4.0mm · 1.09mm/px · 6 of 17 slices shown (1 of 2)]
[im 1/17]
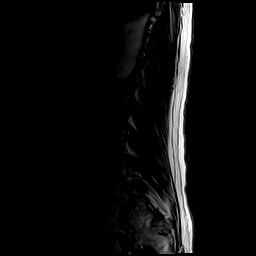
[im 4/17]
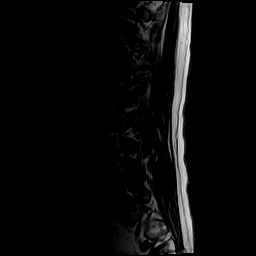
[im 7/17]
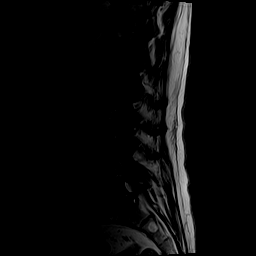
[im 10/17]
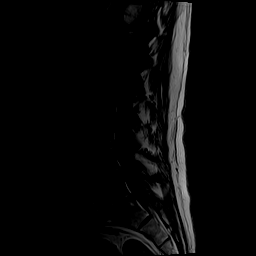
[im 13/17]
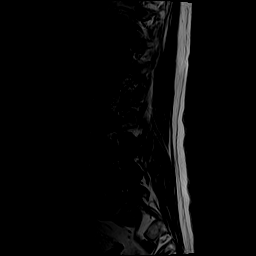
[im 17/17]
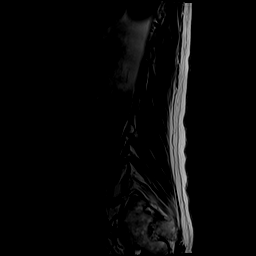

[Series 5: T2 · axial · 4.0mm · 0.39mm/px · z∈[-153,+59]mm · 9 of 41 slices shown (2 of 2)]
[im 1/41]
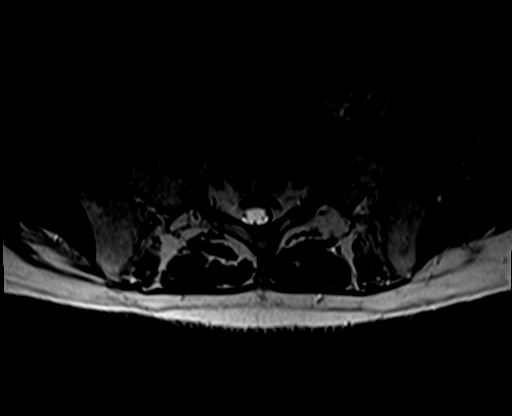
[im 6/41]
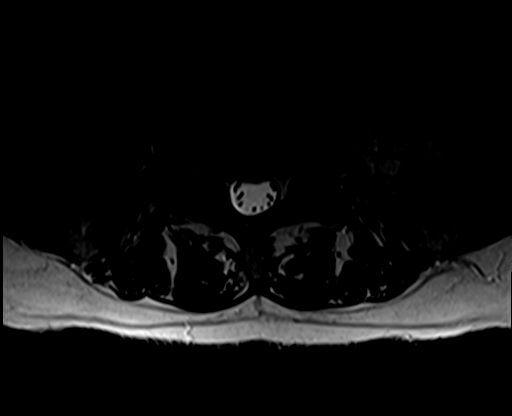
[im 12/41]
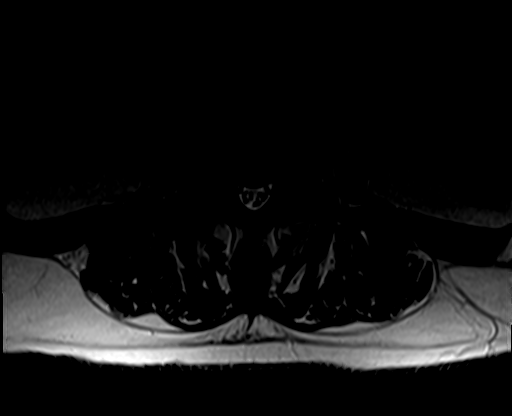
[im 18/41]
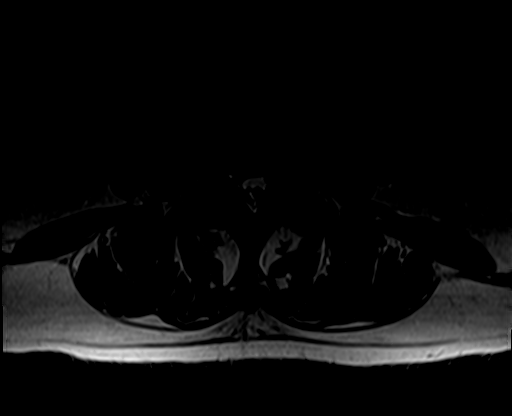
[im 21/41]
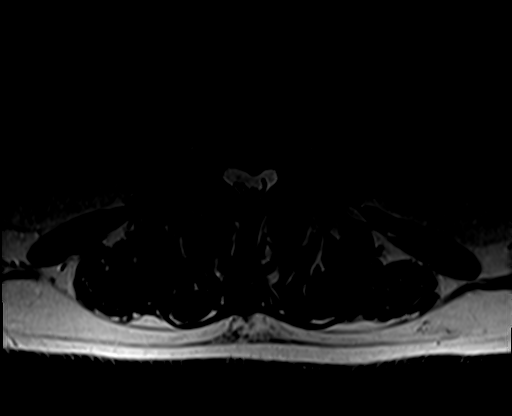
[im 23/41]
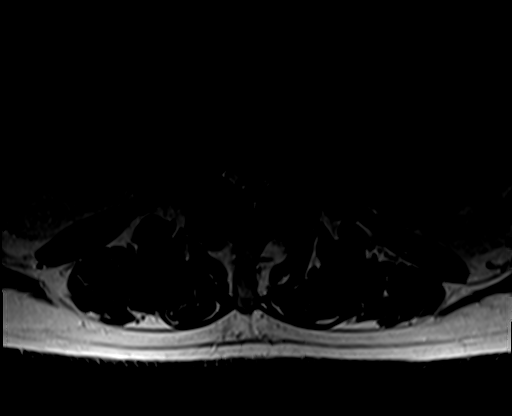
[im 29/41]
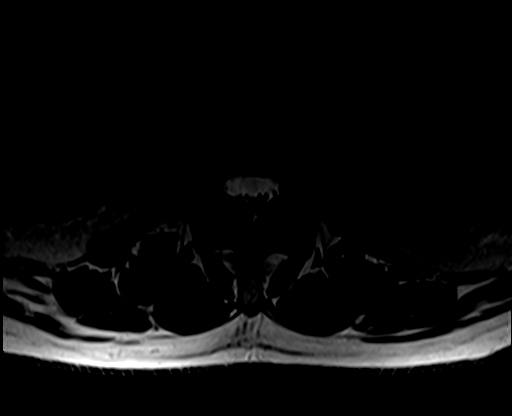
[im 35/41]
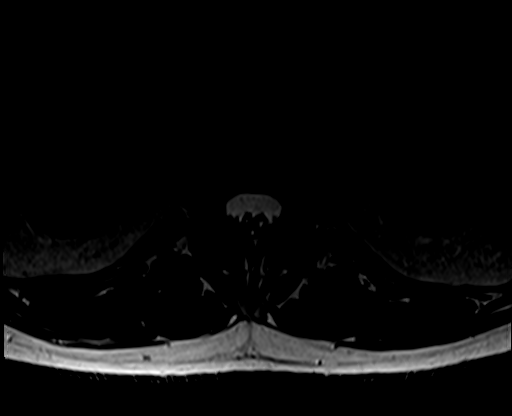
[im 41/41]
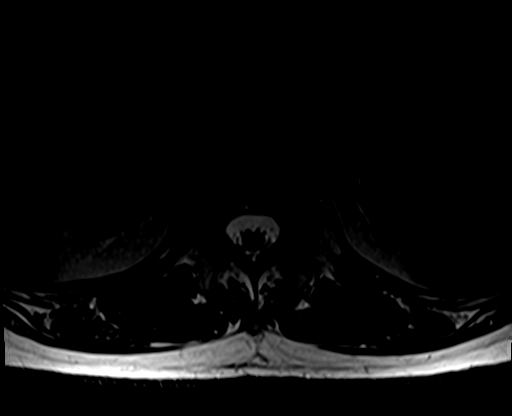

[Series 6: T1 · axial · 4.0mm · 0.39mm/px · z∈[-153,+59]mm · 9 of 41 slices shown (2 of 2)]
[im 1/41]
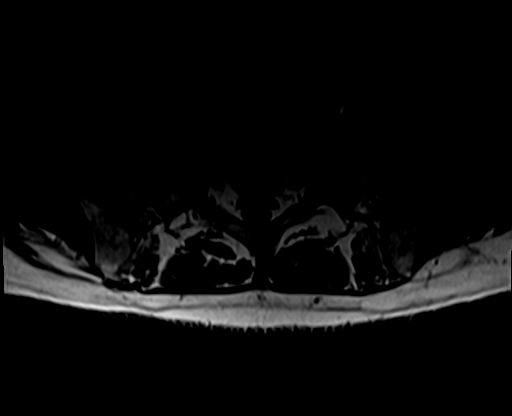
[im 6/41]
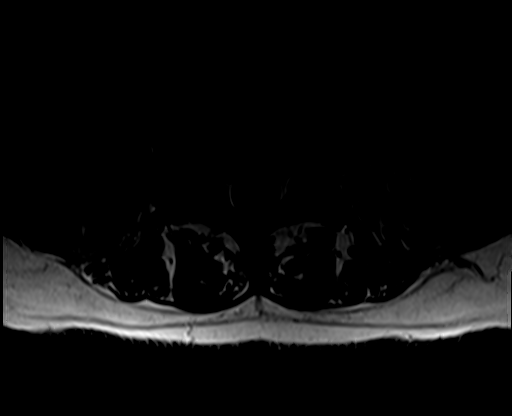
[im 12/41]
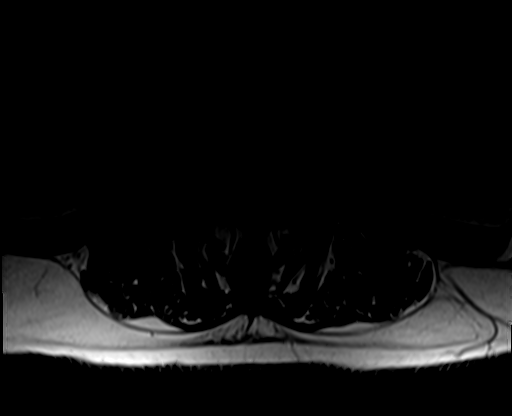
[im 18/41]
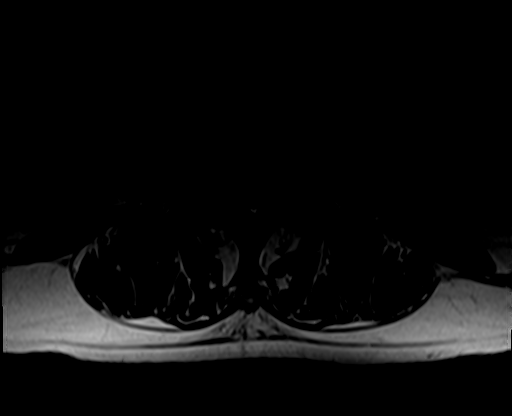
[im 21/41]
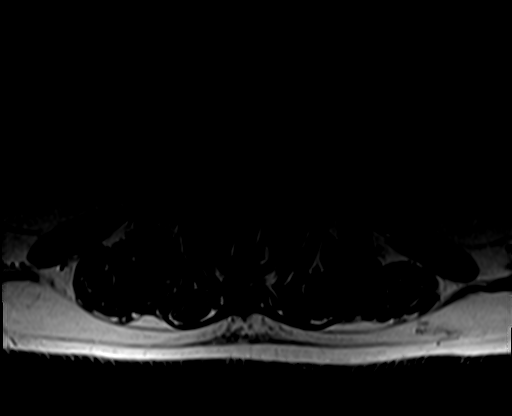
[im 23/41]
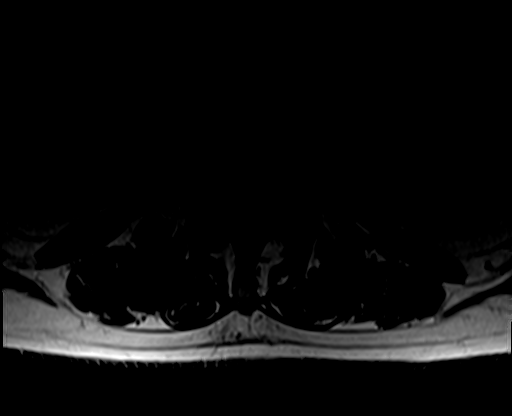
[im 29/41]
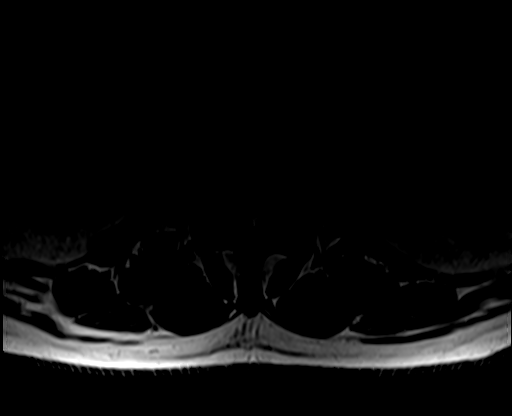
[im 35/41]
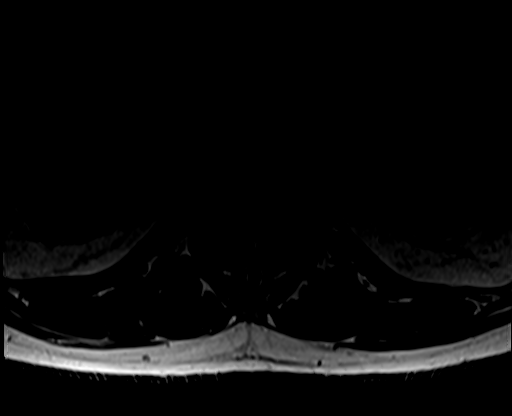
[im 41/41]
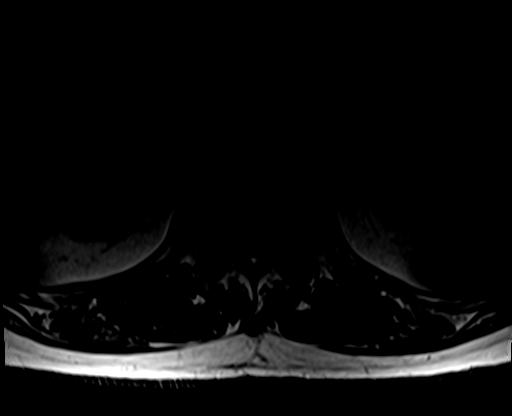

[32 of 48 positions shown; findings below may reference images not displayed]

FINDINGS: Segmentation:  Standard.

Alignment:  Physiologic.

Vertebrae: No acute fracture, evidence of discitis, or aggressive
bone lesion.

Conus medullaris and cauda equina: Conus extends to the T12-L1
level. Conus and cauda equina appear normal.

Paraspinal and other soft tissues: No acute paraspinal abnormality.

Disc levels:

Disc spaces: Degenerative disease with disc height loss throughout
the thoracolumbar spine.

T12-L1: Minimal broad-based disc bulge. No foraminal or central
canal stenosis.

L1-L2: Mild broad-based disc bulge flattening the ventral thecal
sac. Mild bilateral facet arthropathy. No foraminal central canal
stenosis.

L2-L3: Broad-based disc osteophyte complex with a small central disc
protrusion. Moderate bilateral facet arthropathy. Moderate spinal
stenosis. Bilateral subarticular recess stenosis. No foraminal
stenosis.

L3-L4: Mild broad-based disc bulge with a small right paracentral
disc protrusion contacting the right intraspinal L4 nerve root.
Moderate bilateral facet arthropathy. Bilateral subarticular recess
stenosis, right greater than left. Mild spinal stenosis. Mild
bilateral foraminal stenosis.

L4-L5: Broad-based disc bulge with a set small central disc
protrusion. Moderate bilateral facet arthropathy. Bilateral
subarticular recess stenosis. Mild-moderate spinal stenosis. Mild
left and moderate right foraminal stenosis.

L5-S1: Broad-based disc bulge with bilateral lateral disc osteophyte
complexes. Mild bilateral facet arthropathy. Mild right and moderate
left foraminal stenosis. Left subarticular recess stenosis.
IMPRESSION: 1. Diffuse lumbar spine spondylosis as described above.
2. No acute osseous injury of the lumbar spine.

## 2021-07-17 NOTE — Telephone Encounter (Signed)
Please call patient, MRI of lumbar showed diffuse lumbar spine spondylosis, moderate spinal canal stenosis at L3-3, bilateral subarticular recess stenosis, ? ?There is no significant canal stenosis at other levels, variable degree of foraminal stenosis. ? ?Please let him proceed with scheduled neurosurgical evaluation ?

## 2021-07-17 NOTE — Telephone Encounter (Signed)
Left detailed VM on pt's phone (as per DPR). Relayed message regarding MRI. Informed pt to follow up with neurosurgery. Left call back number for further questions. ?

## 2021-07-31 DIAGNOSIS — M4712 Other spondylosis with myelopathy, cervical region: Secondary | ICD-10-CM | POA: Diagnosis not present

## 2021-08-22 DIAGNOSIS — L609 Nail disorder, unspecified: Secondary | ICD-10-CM | POA: Diagnosis not present

## 2021-08-22 DIAGNOSIS — D225 Melanocytic nevi of trunk: Secondary | ICD-10-CM | POA: Diagnosis not present

## 2021-08-22 DIAGNOSIS — L821 Other seborrheic keratosis: Secondary | ICD-10-CM | POA: Diagnosis not present

## 2021-08-22 DIAGNOSIS — L570 Actinic keratosis: Secondary | ICD-10-CM | POA: Diagnosis not present

## 2021-08-22 DIAGNOSIS — L814 Other melanin hyperpigmentation: Secondary | ICD-10-CM | POA: Diagnosis not present

## 2021-08-27 DIAGNOSIS — L601 Onycholysis: Secondary | ICD-10-CM | POA: Diagnosis not present

## 2021-09-10 DIAGNOSIS — E119 Type 2 diabetes mellitus without complications: Secondary | ICD-10-CM | POA: Diagnosis not present

## 2021-09-10 DIAGNOSIS — M4712 Other spondylosis with myelopathy, cervical region: Secondary | ICD-10-CM | POA: Diagnosis not present

## 2021-09-10 DIAGNOSIS — M4802 Spinal stenosis, cervical region: Secondary | ICD-10-CM | POA: Diagnosis not present

## 2021-10-02 DIAGNOSIS — M4712 Other spondylosis with myelopathy, cervical region: Secondary | ICD-10-CM | POA: Diagnosis not present

## 2021-12-15 DIAGNOSIS — Z23 Encounter for immunization: Secondary | ICD-10-CM | POA: Diagnosis not present

## 2021-12-24 DIAGNOSIS — L578 Other skin changes due to chronic exposure to nonionizing radiation: Secondary | ICD-10-CM | POA: Diagnosis not present

## 2021-12-24 DIAGNOSIS — L570 Actinic keratosis: Secondary | ICD-10-CM | POA: Diagnosis not present

## 2022-01-01 ENCOUNTER — Ambulatory Visit: Payer: Medicare Other | Admitting: Neurology

## 2022-01-01 ENCOUNTER — Encounter: Payer: Self-pay | Admitting: Neurology

## 2022-01-01 VITALS — BP 116/70 | HR 82 | Ht 69.5 in | Wt 158.5 lb

## 2022-01-01 DIAGNOSIS — R29898 Other symptoms and signs involving the musculoskeletal system: Secondary | ICD-10-CM

## 2022-01-01 DIAGNOSIS — M5412 Radiculopathy, cervical region: Secondary | ICD-10-CM | POA: Diagnosis not present

## 2022-01-01 DIAGNOSIS — G959 Disease of spinal cord, unspecified: Secondary | ICD-10-CM | POA: Diagnosis not present

## 2022-01-01 NOTE — Progress Notes (Signed)
[facility]  PATIENT: Maxwell Thomas DOB: March 05, 1946  REQUESTING CLINICIAN: Alroy Dust, L.Marlou Sa, MD HISTORY FROM: Patient  REASON FOR VISIT: Left arm weakness    HISTORICAL  CHIEF COMPLAINT:  Chief Complaint  Patient presents with   Follow-up    Rm 12. Alone. Pt does not have relief from sx. C/o left arm weakness. Denies any right leg pain.    INTERVAL HISTORY 01/01/2022:  Patient presents today for follow-up, last visit was in April.  Since that visit, he did have the EMG nerve conduction study which showed evidence of active left cervical radiculopathy.  He did follow-up with surgery and had cervical surgery back in June.  He recovered very well from the surgery standpoint but has not started physical therapy.  He reported that the left arm weakness is still the same, he has not noted any changes. Denies any pain, states that the left arm is just weak mainly at the shoulder level.    HISTORY OF PRESENT ILLNESS:  This is a 76 year old gentleman past medical history of hypertension, hyperlipidemia, diabetes mellitus who is presenting with complaint of left arm weakness and right leg weakness.  Patient reports that his symptoms started on March 15 after working on the roof, going up and down a ladder.  Initially started with left arm weakness and right leg weakness, sometimes he will feel that the right right leg will give out.  He started using a cane, the weakness continued for 2 to 3 weeks and improve to the point that he is not using the cane anymore.   When he comes to the left arm the symptoms worsen now to the point that he cannot pick up the left arm.  He is very weak at the shoulder level, he still have a good grip but unable to abduct the left arm.  When he presented to the ED 3 weeks after the symptoms started, he did have MRI brain and cervical spine.  The MRI brain did not show any acute abnormality but the MRI cervical spine showed severe canal stenosis at  C4 5 and  C5-6.  Patient was referred to neurosurgery, Dr. Arnoldo Morale for which he had an appointment next month on the 17th.  He denies any pain, denies any numbness, no tingling, states that his only complaint is weakness.   OTHER MEDICAL CONDITIONS: Hypertension, Hyperlipidemia, Diabetes    REVIEW OF SYSTEMS: Full 14 system review of systems performed and negative with exception of: as noted in the HPI   ALLERGIES: No Known Allergies  HOME MEDICATIONS: Outpatient Medications Prior to Visit  Medication Sig Dispense Refill   hydrochlorothiazide (HYDRODIURIL) 25 MG tablet Take 25 mg by mouth daily.     metoprolol succinate (TOPROL-XL) 50 MG 24 hr tablet Take 50 mg by mouth daily. Take with or immediately following a meal.     rosuvastatin (CRESTOR) 10 MG tablet Take 10 mg by mouth daily.     No facility-administered medications prior to visit.    PAST MEDICAL HISTORY: Past Medical History:  Diagnosis Date   Hyperlipidemia    Hypertension     PAST SURGICAL HISTORY: History reviewed. No pertinent surgical history.  FAMILY HISTORY: Family History  Problem Relation Age of Onset   Alzheimer's disease Mother    Heart attack Father    Parkinson's disease Brother     SOCIAL HISTORY: Social History   Socioeconomic History   Marital status: Widowed    Spouse name: Not on file   Number of  children: Not on file   Years of education: Not on file   Highest education level: Not on file  Occupational History   Not on file  Tobacco Use   Smoking status: Never   Smokeless tobacco: Not on file  Substance and Sexual Activity   Alcohol use: No    Alcohol/week: 0.0 standard drinks of alcohol   Drug use: No   Sexual activity: Not on file  Other Topics Concern   Not on file  Social History Narrative   Not on file   Social Determinants of Health   Financial Resource Strain: Not on file  Food Insecurity: Not on file  Transportation Needs: Not on file  Physical Activity: Not on  file  Stress: Not on file  Social Connections: Not on file  Intimate Partner Violence: Not on file    PHYSICAL EXAM  GENERAL EXAM/CONSTITUTIONAL: Vitals:  Vitals:   01/01/22 1341  BP: 116/70  Pulse: 82  Weight: 158 lb 8 oz (71.9 kg)  Height: 5' 9.5" (1.765 m)    Body mass index is 23.07 kg/m. Wt Readings from Last 3 Encounters:  01/01/22 158 lb 8 oz (71.9 kg)  07/02/21 165 lb (74.8 kg)  04/12/15 180 lb 3.2 oz (81.7 kg)   Patient is in no distress; well developed, nourished and groomed; neck is supple  EYES: Pupils round and reactive to light, Visual fields full to confrontation, Extraocular movements intacts,   MUSCULOSKELETAL: Gait, strength, tone, movements noted in Neurologic exam below  NEUROLOGIC: MENTAL STATUS:      No data to display         awake, alert, oriented to person, place and time recent and remote memory intact normal attention and concentration language fluent, comprehension intact, naming intact fund of knowledge appropriate  CRANIAL NERVE:  2nd, 3rd, 4th, 6th - pupils equal and reactive to light, visual fields full to confrontation, extraocular muscles intact, no nystagmus 5th - facial sensation symmetric 7th - facial strength symmetric 8th - hearing intact 9th - palate elevates symmetrically, uvula midline 11th - shoulder shrug symmetric 12th - tongue protrusion midline  MOTOR:  normal bulk and tone, full strength in the RUE, BLE. Left shoulder abduction 2/5, shoulder flexion/extension 2/5. Left elbow extension 5/5 and flexion 4/5 and left wrist flexion/extension 5/5. Finger intrinsics 5/5  SENSORY:  normal and symmetric to light touch, pinprick, temperature, vibration  COORDINATION:  finger-nose-finger, fine finger movements normal  REFLEXES:  deep tendon reflexes present and symmetric  GAIT/STATION:  normal    DIAGNOSTIC DATA (LABS, IMAGING, TESTING) - I reviewed patient records, labs, notes, testing and imaging myself  where available.  Lab Results  Component Value Date   WBC 8.2 06/22/2021   HGB 15.9 06/22/2021   HCT 45.0 06/22/2021   MCV 89.6 06/22/2021   PLT 219 06/22/2021      Component Value Date/Time   NA 139 06/22/2021 1246   K 4.8 06/22/2021 1246   CL 104 06/22/2021 1246   CO2 27 06/22/2021 1246   GLUCOSE 152 (H) 06/22/2021 1246   BUN 17 06/22/2021 1246   CREATININE 0.87 06/22/2021 1246   CREATININE 1.06 04/12/2015 1257   CALCIUM 9.2 06/22/2021 1246   PROT 8.0 06/22/2021 1246   ALBUMIN 4.7 06/22/2021 1246   AST 25 06/22/2021 1246   ALT 23 06/22/2021 1246   ALKPHOS 58 06/22/2021 1246   BILITOT 1.1 06/22/2021 1246   GFRNONAA >60 06/22/2021 1246   GFRAA >60 05/10/2015 1536   No results found  for: "CHOL", "HDL", "LDLCALC", "LDLDIRECT", "TRIG", "CHOLHDL" No results found for: "HGBA1C" No results found for: "VITAMINB12" No results found for: "TSH"   MRI Brain 06/22/2021 1. No acute intracranial abnormality. 2. Periventricular and subcortical T2 hyperintensities bilaterally are mildly advanced for age. The finding is nonspecific but can be seen in the setting of chronic microvascular ischemia, a demyelinating process such as multiple sclerosis, vasculitis, complicated migraine headaches, or as the sequelae of a prior infectious or inflammatory process. 3.  Cerebral Atrophy (ICD10-G31.9).   MRI Cervical spine 06/22/2021 1. C4-C5 severe spinal canal stenosis and severe bilateral neural foraminal narrowing. 2. C5-C6 severe spinal canal stenosis with severe left and mild right neural foraminal narrowing. 3. Additional mild neural foraminal narrowing bilaterally at C2-C3, on the right at C3-C4, and on the left at C7-T1.   EMG/NCS 07/11/21 This is an abnormal study.  There is electrodiagnostic evidence of active left cervical radiculopathy, mainly involving left C5, C6 more than C7; mild chronic right cervical radiculopathy involving C5-6.  There is also evidence of moderate right carpal  tunnel syndromes.  There is chronic bilateral lumbosacral radiculopathy, involving L4-5 S1, right worse than left. There is no evidence of large fiber peripheral neuropathy.   ASSESSMENT AND PLAN  77 y.o. year old male with hypertension, hyperlipidemia, diabetes mellitus who is presenting for follow up for cervical radiculopathy and myelopathy.  He is status post surgery, recovering well from surgery standpoint but still having left-sided weakness.  Her weakness is stable, has not worsened.  At this time I am going to refer him to physical therapy.  He is set up to see his surgeon this Friday.  I encouraged him to complete physical therapy and I will see him in 3 months for follow-up.  He voiced understanding.    1. Cervical myelopathy (HCC)   2. Cervical radiculopathy   3. Left arm weakness     Patient Instructions  Proceed with physical therapy Follow-up with general surgeon as scheduled Follow-up in 3 months or sooner if worse.  Orders Placed This Encounter  Procedures   Ambulatory referral to Physical Therapy    No orders of the defined types were placed in this encounter.   Return in about 3 months (around 04/03/2022).  I have spent a total of 40 minutes dedicated to this patient today, preparing to see patient, performing a medically appropriate examination and evaluation, ordering tests and/or medications and procedures, and counseling and educating the patient/family/caregiver; independently interpreting result and communicating results to the family/patient/caregiver; most of the time was spent answering questions about recovery once he start physical therapy, and documenting clinical information in the electronic medical record.   Windell Norfolk, MD 01/01/2022, 4:38 PM  [facility] [address] [place], [place] [address] [phone]

## 2022-01-01 NOTE — Patient Instructions (Signed)
Proceed with physical therapy Follow-up with general surgeon as scheduled Follow-up in 3 months or sooner if worse.

## 2022-01-03 ENCOUNTER — Ambulatory Visit: Payer: Medicare Other | Attending: Family Medicine

## 2022-01-03 ENCOUNTER — Telehealth: Payer: Self-pay

## 2022-01-03 ENCOUNTER — Other Ambulatory Visit: Payer: Self-pay | Admitting: Neurology

## 2022-01-03 DIAGNOSIS — M5412 Radiculopathy, cervical region: Secondary | ICD-10-CM | POA: Diagnosis not present

## 2022-01-03 DIAGNOSIS — G959 Disease of spinal cord, unspecified: Secondary | ICD-10-CM | POA: Insufficient documentation

## 2022-01-03 DIAGNOSIS — R29898 Other symptoms and signs involving the musculoskeletal system: Secondary | ICD-10-CM | POA: Diagnosis not present

## 2022-01-03 DIAGNOSIS — R269 Unspecified abnormalities of gait and mobility: Secondary | ICD-10-CM

## 2022-01-03 DIAGNOSIS — M6281 Muscle weakness (generalized): Secondary | ICD-10-CM | POA: Insufficient documentation

## 2022-01-03 DIAGNOSIS — R293 Abnormal posture: Secondary | ICD-10-CM | POA: Insufficient documentation

## 2022-01-03 NOTE — Therapy (Signed)
OUTPATIENT PHYSICAL THERAPY CERVICAL EVALUATION   Patient Name: Maxwell Thomas MRN: [id number] DOB:Dec 15, 1945, 76 y.o., male Today's Date: 01/03/2022   PT End of Session - 01/03/22 1005     Visit Number 1    Number of Visits 13    Date for PT Re-Evaluation 02/14/22    Authorization Type BCBS Medicare    Progress Note Due on Visit 10    PT Start Time 1010    PT Stop Time 1105    PT Time Calculation (min) 55 min    Activity Tolerance Patient tolerated treatment well    Behavior During Therapy WFL for tasks assessed/performed             Past Medical History:  Diagnosis Date   Hyperlipidemia    Hypertension    History reviewed. No pertinent surgical history. Patient Active Problem List   Diagnosis Date Noted   Gait abnormality 07/11/2021   Cervical myelopathy (HCC) 07/11/2021   Left arm weakness 07/11/2021   Diabetes (HCC) 04/12/2015   Nephrolithiasis 04/12/2015   Gallstone 04/12/2015   Essential hypertension 04/12/2015    PCP: L.Lupe Carney, MD  REFERRING PROVIDER: Windell Norfolk, MD   REFERRING DIAG: G95.9 (ICD-10-CM) - Cervical myelopathy [facility]) M54.12 (ICD-10-CM) - Cervical radiculopathy R29.898 (ICD-10-CM) - Left arm weakness   THERAPY DIAG:  Muscle weakness (generalized)  Abnormal posture  Rationale for Evaluation and Treatment Rehabilitation  ONSET DATE: 01/01/2022 referral   SUBJECTIVE:                                                                                                                                                                                                         SUBJECTIVE STATEMENT: March 2023 started having trouble with LUE with dexterity and able to lift arm- patient had extensive work up including imaging and "cervical surgery" and his arm is "no better". Saw surgeon in July after surgery and per patient, surgeon said that they don't consider therapy until "at least 3 months after surgery."  Patient reports "having screws  in my neck" but unable to state specifically the surgery he had (fusion or otherwise). Per patient, he was told at surgical follow up that he had a "complicated surgery" and "rushing into therapy could delay the healing."   PERTINENT HISTORY:  hypertension, hyperlipidemia, diabetes mellitus   PAIN:  Are you having pain? No  PRECAUTIONS: Other: unclear whether he still has surgical/ c-spine precautions.   WEIGHT BEARING RESTRICTIONS  unclear at this time  FALLS:  Has patient fallen in last 6 months? No  LIVING ENVIRONMENT: Lives  with: lives alone Lives in: House/apartment Stairs: No Has following equipment at home: Single point cane and Environmental consultant - 2 wheeled  OCCUPATION: retired  PLOF: Independent  PATIENT GOALS "be able to lift my arm"   OBJECTIVE:   DIAGNOSTIC FINDINGS:  April 2023: MRI cervical spine showed severe canal stenosis at C4 5 and C5-6   PATIENT SURVEYS:  Quick Dash 31.8   COGNITION: Overall cognitive status: Within functional limits for tasks assessed   SENSATION: WFL  POSTURE:  L shoulder depressed and internally rotated  PALPATION: L scapula downwardly rotated, anteriorly tipped, abducted, no sublux noted   CERVICAL ROM: DEFERRED BECAUSE PT UNSURE IF PATIENT WAS FUSED OR NOT   UPPER EXTREMITY ROM:  *significantly limited L shoulder abduction, ER  UPPER EXTREMITY MMT:  MMT Right eval Left eval  Shoulder flexion  1  Shoulder extension  1  Shoulder abduction  0  Shoulder adduction  3  Shoulder extension  2  Shoulder internal rotation  2  Shoulder external rotation  1  Middle trapezius    Lower trapezius    Elbow flexion  2  Elbow extension  4  Wrist flexion  4  Wrist extension  4  Wrist ulnar deviation  4  Wrist radial deviation  4  Wrist pronation  4  Wrist supination  2-  Grip strength     (Blank rows = not tested)    TODAY'S TREATMENT:  N/A eval    PATIENT EDUCATION:  Education details: PT POC, exam findings, PT  prognosis, brachial plexus anatomy Person educated: Patient Education method: Explanation Education comprehension: verbalized understanding   HOME EXERCISE PROGRAM: To be provided  ASSESSMENT:  CLINICAL IMPRESSION: Patient is a 76 y.o. male who was seen today for physical therapy evaluation and treatment for shoulder weakness due to cervical radiculopathy and possibly a failed c-spine surgery. Patient presents with significant weakness in C5-6 myotomes. No palpable deltoid contraction could be felt. He would likely benefit from a post-surgical EMG to assess for the prognosis of neural return if possible. He would benefit from skilled PT services to address the shoulder weakness and resultant functional impairments.     OBJECTIVE IMPAIRMENTS decreased activity tolerance, decreased knowledge of condition, decreased mobility, decreased ROM, decreased strength, hypomobility, impaired tone, impaired UE functional use, and postural dysfunction.   ACTIVITY LIMITATIONS carrying, lifting, bathing, dressing, self feeding, reach over head, and hygiene/grooming  PARTICIPATION LIMITATIONS: meal prep, cleaning, laundry, driving, shopping, community activity, occupation, and yard work  PERSONAL FACTORS Age, Fitness, Past/current experiences, and Time since onset of injury/illness/exacerbation are also affecting patient's functional outcome.   REHAB POTENTIAL: Fair time since onset  CLINICAL DECISION MAKING: Evolving/moderate complexity  EVALUATION COMPLEXITY: Moderate   GOALS: Goals reviewed with patient? Yes  SHORT TERM GOALS: Target date: 01/24/2022   Pt will be independent with initial HEP for improved L UE strength   Baseline: to be provided Goal status: INITIAL  2.  Patient will improve QuickDASH score to </= 25 to show improved UE function Baseline: 31.8 Goal status: INITIAL    LONG TERM GOALS: Target date: 02/14/2022  Pt will be independent with final HEP for improved L UE  strength and function  Baseline: to be provided Goal status: INITIAL  2.  Patient will improve QuickDASH score to </= 20 to demonstrate improved L UE strength and function Baseline: 31.8 Goal status: INITIAL     PLAN: PT FREQUENCY: 2x/week  PT DURATION: 6 weeks  PLANNED INTERVENTIONS: Therapeutic exercises, Therapeutic activity, Neuromuscular  re-education, Balance training, Gait training, Patient/Family education, Self Care, Joint mobilization, Orthotic/Fit training, DME instructions, Electrical stimulation, Taping, Vasopneumatic device, Manual therapy, and Re-evaluation  PLAN FOR NEXT SESSION: finish assessing shoulder, did we get imaging/notes? Initiate HEP   Westley Foots, PT Westley Foots, PT, DPT, CBIS  01/03/2022, 12:19 PM

## 2022-01-03 NOTE — Telephone Encounter (Signed)
Hello Dr. Arnoldo Morale,   I am the PT treating Maxwell Thomas at [facility] on [address] for L UE weakness and following his cervical surgery in June. I do not seem to have access to any of his imaging pre/post surgery or the op/post-op notes. Are you able to provide me with the following information:   -What type of surgery did he have? Did it go as planned or were there complications? -Did he have any precautions- is he still under those precautions?  -Are there plans to have a post-op EMG? He has no palpable L deltoid contraction.   Thank you for this information.  Debbora Dus, PT, DPT, CBIS

## 2022-01-04 DIAGNOSIS — M4712 Other spondylosis with myelopathy, cervical region: Secondary | ICD-10-CM | POA: Diagnosis not present

## 2022-01-07 ENCOUNTER — Ambulatory Visit: Payer: Medicare Other | Admitting: Physical Therapy

## 2022-01-07 DIAGNOSIS — R293 Abnormal posture: Secondary | ICD-10-CM | POA: Diagnosis not present

## 2022-01-07 DIAGNOSIS — M6281 Muscle weakness (generalized): Secondary | ICD-10-CM | POA: Diagnosis not present

## 2022-01-07 DIAGNOSIS — G959 Disease of spinal cord, unspecified: Secondary | ICD-10-CM | POA: Diagnosis not present

## 2022-01-07 DIAGNOSIS — R29898 Other symptoms and signs involving the musculoskeletal system: Secondary | ICD-10-CM | POA: Diagnosis not present

## 2022-01-07 DIAGNOSIS — M5412 Radiculopathy, cervical region: Secondary | ICD-10-CM | POA: Diagnosis not present

## 2022-01-07 NOTE — Therapy (Signed)
OUTPATIENT PHYSICAL THERAPY CERVICAL    Patient Name: Maxwell Thomas MRN: [id number] DOB:Jul 21, 1945, 76 y.o., male Today's Date: 01/07/2022   PT End of Session - 01/07/22 1034     Visit Number 2    Number of Visits 13    Date for PT Re-Evaluation 02/14/22    Authorization Type BCBS Medicare    Progress Note Due on Visit 10    PT Start Time 1032             Past Medical History:  Diagnosis Date   Hyperlipidemia    Hypertension    No past surgical history on file. Patient Active Problem List   Diagnosis Date Noted   Gait abnormality 07/11/2021   Cervical myelopathy ([place]) 07/11/2021   Left arm weakness 07/11/2021   Diabetes ([place]) 04/12/2015   Nephrolithiasis 04/12/2015   Gallstone 04/12/2015   Essential hypertension 04/12/2015    PCP: L.Donnie Coffin, MD  REFERRING PROVIDER: Alric Ran, MD   REFERRING DIAG: G95.9 (ICD-10-CM) - Cervical myelopathy ([place]) M54.12 (ICD-10-CM) - Cervical radiculopathy R29.898 (ICD-10-CM) - Left arm weakness   THERAPY DIAG:  Muscle weakness (generalized)  Abnormal posture  Rationale for Evaluation and Treatment Rehabilitation  ONSET DATE: 01/01/2022 referral   SUBJECTIVE:                                                                                                                                                                                                         SUBJECTIVE STATEMENT: Pt reports working on LUE ROM at home.  Pt wanted to know if Dr. April Manson had been contacted for questions about his surgery.    PERTINENT HISTORY:  hypertension, hyperlipidemia, diabetes mellitus   PAIN:  Are you having pain? No  PRECAUTIONS: Other: unclear whether he still has surgical/ c-spine precautions.   WEIGHT BEARING RESTRICTIONS  unclear at this time  FALLS:  Has patient fallen in last 6 months? No  LIVING ENVIRONMENT: Lives with: lives alone Lives in: House/apartment Stairs: No Has following equipment at home: Single  point cane and Environmental consultant - 2 wheeled  OCCUPATION: retired  PLOF: Independent  PATIENT GOALS "be able to lift my arm"     TODAY'S TREATMENT:  Seated:  Cervical retraction 5x2 and cues for incorporating throughout treatment session  to work on posture.  Scapula protraction and retractions with manual cues.  Bilateral shoulder flexion with cane and AAROM for L UE  LUE abd. With cane and AAROM and manual cues for scapula upward rotation. Multiple reps with each with cues and education on posture and shoulder position.  PATIENT EDUCATION:  Education details: HEP Person educated: Patient Education method: Education officer, environmental, Corporate treasurer cues, Verbal cues, and Handouts Education comprehension: verbalized understanding, returned demonstration, verbal cues required, tactile cues required, and needs further education   HOME EXERCISE PROGRAM: Access Code: [email or web address] URL: [email or web address] Date: 01/07/2022 Prepared by: Oneita Kras  Exercises - Seated Passive Cervical Retraction  - 1-2 x daily - 5 x weekly - 1-2 sets - 5 reps - 5 hold - Seated Shoulder Flexion AAROM with Dowel  - 1-2 x daily - 5 x weekly - 1-2 sets - 5 reps - 5 hold - Seated Shoulder Abduction AAROM with Dowel  - 1-2 x daily - 5 x weekly - 1-2 sets - 5 reps - 5 hold  ASSESSMENT:  CLINICAL IMPRESSION: Pt tolerated working on seated posture and L shoulder ROM and noted slight increased AAROM in L shoulder without pain.  Initiated HEP for posture and LUE ROM.  Continue with POC.  OBJECTIVE IMPAIRMENTS decreased activity tolerance, decreased knowledge of condition, decreased mobility, decreased ROM, decreased strength, hypomobility, impaired tone, impaired UE functional use, and postural dysfunction.   ACTIVITY LIMITATIONS carrying, lifting, bathing, dressing, self feeding, reach over head, and hygiene/grooming  PARTICIPATION LIMITATIONS: meal prep, cleaning, laundry, driving, shopping, community activity,  occupation, and yard work  PERSONAL FACTORS Age, Fitness, Past/current experiences, and Time since onset of injury/illness/exacerbation are also affecting patient's functional outcome.   REHAB POTENTIAL: Fair time since onset  CLINICAL DECISION MAKING: Evolving/moderate complexity  EVALUATION COMPLEXITY: Moderate   GOALS: Goals reviewed with patient? Yes  SHORT TERM GOALS: Target date: 01/24/2022   Pt will be independent with initial HEP for improved L UE strength   Baseline: to be provided Goal status: INITIAL  2.  Patient will improve QuickDASH score to </= 25 to show improved UE function Baseline: 31.8 Goal status: INITIAL    LONG TERM GOALS: Target date: 02/14/2022  Pt will be independent with final HEP for improved L UE strength and function  Baseline: to be provided Goal status: INITIAL  2.  Patient will improve QuickDASH score to </= 20 to demonstrate improved L UE strength and function Baseline: 31.8 Goal status: INITIAL     PLAN: PT FREQUENCY: 2x/week  PT DURATION: 6 weeks  PLANNED INTERVENTIONS: Therapeutic exercises, Therapeutic activity, Neuromuscular re-education, Balance training, Gait training, Patient/Family education, Self Care, Joint mobilization, Orthotic/Fit training, DME instructions, Electrical stimulation, Taping, Vasopneumatic device, Manual therapy, and Re-evaluation  PLAN FOR NEXT SESSION: finish assessing shoulder, did we get imaging/notes? Initiate HEP  Bjorn Loser, PTA  01/07/22, 1:34 PM

## 2022-01-09 ENCOUNTER — Ambulatory Visit: Payer: Medicare Other | Admitting: Physical Therapy

## 2022-01-09 ENCOUNTER — Encounter: Payer: Self-pay | Admitting: Physical Therapy

## 2022-01-09 DIAGNOSIS — M6281 Muscle weakness (generalized): Secondary | ICD-10-CM

## 2022-01-09 DIAGNOSIS — G959 Disease of spinal cord, unspecified: Secondary | ICD-10-CM | POA: Diagnosis not present

## 2022-01-09 DIAGNOSIS — R293 Abnormal posture: Secondary | ICD-10-CM | POA: Diagnosis not present

## 2022-01-09 DIAGNOSIS — M5412 Radiculopathy, cervical region: Secondary | ICD-10-CM | POA: Diagnosis not present

## 2022-01-09 DIAGNOSIS — R29898 Other symptoms and signs involving the musculoskeletal system: Secondary | ICD-10-CM | POA: Diagnosis not present

## 2022-01-09 NOTE — Therapy (Signed)
OUTPATIENT PHYSICAL THERAPY CERVICAL TREATMENT   Patient Name: Maxwell Thomas MRN: [id number] DOB:[birth year], 76 y.o., male Today's Date: 01/09/2022   PT End of Session - 01/09/22 1535     Visit Number 3    Number of Visits 13    Date for PT Re-Evaluation 02/14/22    Authorization Type BCBS Medicare    Progress Note Due on Visit 10    PT Start Time 1525    PT Stop Time 1617    PT Time Calculation (min) 52 min    Activity Tolerance Patient tolerated treatment well    Behavior During Therapy WFL for tasks assessed/performed             Past Medical History:  Diagnosis Date   Hyperlipidemia    Hypertension    History reviewed. No pertinent surgical history. Patient Active Problem List   Diagnosis Date Noted   Gait abnormality 07/11/2021   Cervical myelopathy ([facility]) 07/11/2021   Left arm weakness 07/11/2021   Diabetes ([facility]) 04/12/2015   Nephrolithiasis 04/12/2015   Gallstone 04/12/2015   Essential hypertension 04/12/2015    PCP: L.Lupe Carney, MD  REFERRING PROVIDER: Windell Norfolk, MD   REFERRING DIAG: G95.9 (ICD-10-CM) - Cervical myelopathy ([facility]) M54.12 (ICD-10-CM) - Cervical radiculopathy R29.898 (ICD-10-CM) - Left arm weakness   THERAPY DIAG:  Muscle weakness (generalized)  Abnormal posture  Rationale for Evaluation and Treatment Rehabilitation  ONSET DATE: 01/01/2022 referral   SUBJECTIVE:                                                                                                                                                                                                         SUBJECTIVE STATEMENT: Pt continues to have same difficulties from prior.  He inquires about updates from the surgeon.  States he has an appt with Dr. Terrace Arabia for EMG on 03/06/2022.  He states he will bring in available printed info regarding surgery at next visit.  He verbalizes frustration with clinic procedures from prior 2 visits.    PERTINENT HISTORY:  hypertension,  hyperlipidemia, diabetes mellitus   PAIN:  Are you having pain? No  PRECAUTIONS: Other: unclear whether he still has surgical/ c-spine precautions.   WEIGHT BEARING RESTRICTIONS  unclear at this time  FALLS:  Has patient fallen in last 6 months? No  LIVING ENVIRONMENT: Lives with: lives alone Lives in: House/apartment Stairs: No Has following equipment at home: Single point cane and Environmental consultant - 2 wheeled  OCCUPATION: retired  PLOF: Independent  PATIENT GOALS "be able to lift my arm"  TODAY'S TREATMENT:  Initial 9 minutes of session PT answering questions and providing therapeutic listening to pt regarding subjective statement.  LUE PROM: -Elbow WFL -Shoulder flexion limited to 93 deg passively -Shoulder abduction limited to 79 deg w/ pain at origin of long head of biceps -Palpation of head of humerus in passive abduction in supine appears to be a single finger width sublux compared to right UE -Supine ER at 45 deg abduction limited to 59 deg, no pain, reassessed at 90 both active and passive 10 deg Pt notes that he is having trouble functionally using the left hand unless the arm is hanging vertically and he is carrying something like groceries.  Edu on this being dangerous due to reliance on passive structures in the shoulder and instability that this could create. -Assessed supine AROM shoulder abduction, pt compensates w/ rotational momentum and trunk movement, no palpable deltoid contraction. -Cane assisted left shoulder abduction x15 in 30 deg arch of motion in supine -Pt unable to actively supinate forearm and falls into pronation during all LUE motion, compensates during active shoulder flexion w/ internal rotation -Cane assisted ER at 45 deg of abduction 10x10sec hold -Supine cervical chin tucks x15 -Seated cervical rotation isometrics 5x2 sec each, using AAROM for LUE, added to HEP -Active assisted elbow flexion x15, added to HEP  PATIENT EDUCATION:  Education  details: Continue HEP, answered pt questions relevant to visit Monday and evaluation, EMG scheduled, and awaiting further information about surgery.  Pt states he will bring in information that he has at home. Person educated: Patient Education method: Explanation, Demonstration, Tactile cues, Verbal cues, and Handouts Education comprehension: verbalized understanding, returned demonstration, verbal cues required, tactile cues required, and needs further education   HOME EXERCISE PROGRAM: Access Code: [email or web address] URL: [email or web address] Date: 01/07/2022 Prepared by: Oneita Kras  Exercises - Seated Passive Cervical Retraction  - 1-2 x daily - 5 x weekly - 1-2 sets - 5 reps - 5 hold - Seated Shoulder Flexion AAROM with Dowel  - 1-2 x daily - 5 x weekly - 1-2 sets - 5 reps - 5 hold - Seated Shoulder Abduction AAROM with Dowel  - 1-2 x daily - 5 x weekly - 1-2 sets - 5 reps - 5 hold - Seated Elbow Flexion AAROM  - 1 x daily - 5 x weekly - 3 sets - 10 reps - Seated Isometric Cervical Rotation  - 1 x daily - 5 x weekly - 1 sets - 10 reps - 2 seconds hold  ASSESSMENT:  CLINICAL IMPRESSION: Continued to assess and treat the left UE deficits noted from eval.  Pt continues to lack active ROM in the C5-C6 myotome.  Address AAROM of the shoulder this visit with respect to not overstretch the shoulder as therapist is still awaiting further details of the operation.  Pt reports and demonstrates decreased functionality of the LUE in supine and seated positions.  He continues to benefit from skilled PT to further address motor deficits and teach modifications and compensations as will likely be needed long term.  OBJECTIVE IMPAIRMENTS decreased activity tolerance, decreased knowledge of condition, decreased mobility, decreased ROM, decreased strength, hypomobility, impaired tone, impaired UE functional use, and postural dysfunction.   ACTIVITY LIMITATIONS carrying, lifting, bathing, dressing,  self feeding, reach over head, and hygiene/grooming  PARTICIPATION LIMITATIONS: meal prep, cleaning, laundry, driving, shopping, community activity, occupation, and yard work  PERSONAL FACTORS Age, Fitness, Past/current experiences, and Time since onset of injury/illness/exacerbation are also affecting patient's functional outcome.   REHAB  POTENTIAL: Fair time since onset  CLINICAL DECISION MAKING: Evolving/moderate complexity  EVALUATION COMPLEXITY: Moderate   GOALS: Goals reviewed with patient? Yes  SHORT TERM GOALS: Target date: 01/24/2022   Pt will be independent with initial HEP for improved L UE strength   Baseline: to be provided Goal status: INITIAL  2.  Patient will improve QuickDASH score to </= 25 to show improved UE function Baseline: 31.8 Goal status: INITIAL    LONG TERM GOALS: Target date: 02/14/2022  Pt will be independent with final HEP for improved L UE strength and function  Baseline: to be provided Goal status: INITIAL  2.  Patient will improve QuickDASH score to </= 20 to demonstrate improved L UE strength and function Baseline: 31.8 Goal status: INITIAL     PLAN: PT FREQUENCY: 2x/week  PT DURATION: 6 weeks  PLANNED INTERVENTIONS: Therapeutic exercises, Therapeutic activity, Neuromuscular re-education, Balance training, Gait training, Patient/Family education, Self Care, Joint mobilization, Orthotic/Fit training, DME instructions, Electrical stimulation, Taping, Vasopneumatic device, Manual therapy, and Re-evaluation  PLAN FOR NEXT SESSION: finish assessing shoulder, did we get imaging/notes? Modify HEP prn, continue PROM/AAROM of the shoulder/supination/elbow flexion-do not emphasize stretching until notes received from surgeon!, try weight-bearing through the arm in differing positions emphasizing most functional hand/wrist positions  Camille Bal, PT, DPT 01/09/22, 6:36 PM

## 2022-01-09 NOTE — Patient Instructions (Signed)
Access Code: [email or web address] URL: [email or web address] Date: 01/09/2022 Prepared by: Elease Etienne  Exercises - Seated Passive Cervical Retraction  - 1-2 x daily - 5 x weekly - 1-2 sets - 5 reps - 5 hold - Seated Shoulder Flexion AAROM with Dowel  - 1-2 x daily - 5 x weekly - 1-2 sets - 5 reps - 5 hold - Seated Shoulder Abduction AAROM with Dowel  - 1-2 x daily - 5 x weekly - 1-2 sets - 5 reps - 5 hold - Seated Elbow Flexion AAROM  - 1 x daily - 5 x weekly - 3 sets - 10 reps - Seated Isometric Cervical Rotation  - 1 x daily - 5 x weekly - 1 sets - 10 reps - 2 seconds hold

## 2022-01-14 ENCOUNTER — Ambulatory Visit: Payer: Medicare Other | Admitting: Physical Therapy

## 2022-01-16 ENCOUNTER — Ambulatory Visit: Payer: Medicare Other | Attending: Family Medicine | Admitting: Physical Therapy

## 2022-01-16 DIAGNOSIS — R293 Abnormal posture: Secondary | ICD-10-CM | POA: Diagnosis not present

## 2022-01-16 DIAGNOSIS — M6281 Muscle weakness (generalized): Secondary | ICD-10-CM | POA: Insufficient documentation

## 2022-01-16 NOTE — Therapy (Signed)
OUTPATIENT PHYSICAL THERAPY CERVICAL TREATMENT   Patient Name: Maxwell Thomas MRN: [id number] DOB:June 29, 1945, 76 y.o., male Today's Date: 01/16/2022   PT End of Session - 01/16/22 1019     Visit Number 4    Number of Visits 13    Date for PT Re-Evaluation 02/14/22    Authorization Type BCBS Medicare    Progress Note Due on Visit 10    PT Start Time 1018    PT Stop Time 1104    PT Time Calculation (min) 46 min    Activity Tolerance Patient tolerated treatment well    Behavior During Therapy WFL for tasks assessed/performed              Past Medical History:  Diagnosis Date   Hyperlipidemia    Hypertension    No past surgical history on file. Patient Active Problem List   Diagnosis Date Noted   Gait abnormality 07/11/2021   Cervical myelopathy ([place]) 07/11/2021   Left arm weakness 07/11/2021   Diabetes ([place]) 04/12/2015   Nephrolithiasis 04/12/2015   Gallstone 04/12/2015   Essential hypertension 04/12/2015    PCP: L.Donnie Coffin, MD  REFERRING PROVIDER: Alric Ran, MD   REFERRING DIAG: G95.9 (ICD-10-CM) - Cervical myelopathy ([place]) M54.12 (ICD-10-CM) - Cervical radiculopathy R29.898 (ICD-10-CM) - Left arm weakness   THERAPY DIAG:  Muscle weakness (generalized)  Abnormal posture  Rationale for Evaluation and Treatment Rehabilitation  ONSET DATE: 01/01/2022 referral   SUBJECTIVE:                                                                                                                                                                                                         SUBJECTIVE STATEMENT: Pt reports no acute changes since last session. Pt reports no pain today. Pt with some difficulty doffing his coat from his LUE this morning. Pt reports he experiences the most difficulty with dressing, shaving, ADLs due to LUE weakness.  PERTINENT HISTORY:  hypertension, hyperlipidemia, diabetes mellitus   PAIN:  Are you having pain? No  PRECAUTIONS:  Other: unclear whether he still has surgical/ c-spine precautions.   WEIGHT BEARING RESTRICTIONS  unclear at this time  FALLS:  Has patient fallen in last 6 months? No  LIVING ENVIRONMENT: Lives with: lives alone Lives in: House/apartment Stairs: No Has following equipment at home: Single point cane and Environmental consultant - 2 wheeled  OCCUPATION: retired  PLOF: Independent  PATIENT GOALS "be able to lift my arm"    TODAY'S TREATMENT:   THER ACT: Discussion with pt's primary PT regarding reaching out to Dr. Arnoldo Morale  for information about pt's surgery. Telephone Encounter sent to Dr. Arnoldo Morale but primary PT to reach out via phone call to obtain further information regarding pt's surgery.  THER EX:  Supine cane-assisted L shoulder abduction x 10 reps each with AAROM from therapist  Supine cane-assisted L shoulder flexion x 10 reps with AAROM from therapist  Supine cane-assisted L shoulder ER x 10 reps with AAROM from therapist   Pt has increase in pain in L distal biceps with PROM at end-range that resolves at rest.   PATIENT EDUCATION:  Education details: Continue HEP Person educated: Patient Education method: Explanation, Demonstration, Tactile cues, Verbal cues, and Handouts Education comprehension: verbalized understanding, returned demonstration, verbal cues required, tactile cues required, and needs further education   HOME EXERCISE PROGRAM: Access Code: [email or web address] URL: [email or web address] Date: 01/07/2022 Prepared by: Oneita Kras  Exercises - Seated Passive Cervical Retraction  - 1-2 x daily - 5 x weekly - 1-2 sets - 5 reps - 5 hold - Seated Shoulder Flexion AAROM with Dowel  - 1-2 x daily - 5 x weekly - 1-2 sets - 5 reps - 5 hold - Seated Shoulder Abduction AAROM with Dowel  - 1-2 x daily - 5 x weekly - 1-2 sets - 5 reps - 5 hold - Seated Elbow Flexion AAROM  - 1 x daily - 5 x weekly - 3 sets - 10 reps - Seated Isometric Cervical Rotation  - 1 x daily - 5 x weekly  - 1 sets - 10 reps - 2 seconds hold   ASSESSMENT:  CLINICAL IMPRESSION: Emphasis of skilled PT session on reviewing status of updates from pt's surgeon and working on L shoulder PROM to AAROM. Pt continues to exhibit pain in his distal L biceps with end-range PROM that resolves at rest. Pt also exhibits decreased L shoulder PROM as well as decreased strength. Pt continues to benefit from skilled therapy services to address ongoing decreased function in LUE. Continue POC.   OBJECTIVE IMPAIRMENTS decreased activity tolerance, decreased knowledge of condition, decreased mobility, decreased ROM, decreased strength, hypomobility, impaired tone, impaired UE functional use, and postural dysfunction.   ACTIVITY LIMITATIONS carrying, lifting, bathing, dressing, self feeding, reach over head, and hygiene/grooming  PARTICIPATION LIMITATIONS: meal prep, cleaning, laundry, driving, shopping, community activity, occupation, and yard work  PERSONAL FACTORS Age, Fitness, Past/current experiences, and Time since onset of injury/illness/exacerbation are also affecting patient's functional outcome.   REHAB POTENTIAL: Fair time since onset  CLINICAL DECISION MAKING: Evolving/moderate complexity  EVALUATION COMPLEXITY: Moderate   GOALS: Goals reviewed with patient? Yes  SHORT TERM GOALS: Target date: 01/24/2022   Pt will be independent with initial HEP for improved L UE strength   Baseline: to be provided Goal status: INITIAL  2.  Patient will improve QuickDASH score to </= 25 to show improved UE function Baseline: 31.8 Goal status: INITIAL    LONG TERM GOALS: Target date: 02/14/2022  Pt will be independent with final HEP for improved L UE strength and function  Baseline: to be provided Goal status: INITIAL  2.  Patient will improve QuickDASH score to </= 20 to demonstrate improved L UE strength and function Baseline: 31.8 Goal status: INITIAL     PLAN: PT FREQUENCY: 2x/week  PT  DURATION: 6 weeks  PLANNED INTERVENTIONS: Therapeutic exercises, Therapeutic activity, Neuromuscular re-education, Balance training, Gait training, Patient/Family education, Self Care, Joint mobilization, Orthotic/Fit training, DME instructions, Electrical stimulation, Taping, Vasopneumatic device, Manual therapy, and Re-evaluation  PLAN FOR NEXT SESSION: did we get imaging/notes? Modify  HEP prn, continue PROM/AAROM of the shoulder/supination/elbow flexion-do not emphasize stretching until notes received from surgeon!, try weight-bearing through the arm in differing positions emphasizing most functional hand/wrist positions, initiate stretches for neck, printout for shoulder flex (can he do indep in supine?)     Excell Seltzer, PT, DPT, CSRS  01/16/22, 11:04 AM

## 2022-01-18 ENCOUNTER — Ambulatory Visit: Payer: Medicare Other | Admitting: Physical Therapy

## 2022-01-18 DIAGNOSIS — M6281 Muscle weakness (generalized): Secondary | ICD-10-CM | POA: Diagnosis not present

## 2022-01-18 DIAGNOSIS — R293 Abnormal posture: Secondary | ICD-10-CM

## 2022-01-18 NOTE — Therapy (Signed)
OUTPATIENT PHYSICAL THERAPY CERVICAL TREATMENT   Patient Name: Maxwell Thomas MRN: [id number] DOB:1946-03-04, 76 y.o., male Today's Date: 01/18/2022   PT End of Session - 01/18/22 1110     Visit Number 5    Number of Visits 13    Date for PT Re-Evaluation 02/14/22    Authorization Type BCBS Medicare    Progress Note Due on Visit 10    PT Start Time 1100    PT Stop Time 1147    PT Time Calculation (min) 47 min    Activity Tolerance Patient tolerated treatment well    Behavior During Therapy WFL for tasks assessed/performed               Past Medical History:  Diagnosis Date   Hyperlipidemia    Hypertension    No past surgical history on file. Patient Active Problem List   Diagnosis Date Noted   Gait abnormality 07/11/2021   Cervical myelopathy (HCC) 07/11/2021   Left arm weakness 07/11/2021   Diabetes (HCC) 04/12/2015   Nephrolithiasis 04/12/2015   Gallstone 04/12/2015   Essential hypertension 04/12/2015    PCP: L.Lupe Carney, MD  REFERRING PROVIDER: Windell Norfolk, MD   REFERRING DIAG: G95.9 (ICD-10-CM) - Cervical myelopathy (HCC) M54.12 (ICD-10-CM) - Cervical radiculopathy R29.898 (ICD-10-CM) - Left arm weakness   THERAPY DIAG:  Muscle weakness (generalized)  Abnormal posture  Rationale for Evaluation and Treatment Rehabilitation  ONSET DATE: 01/01/2022 referral   SUBJECTIVE:                                                                                                                                                                                                         SUBJECTIVE STATEMENT: Pt reports no pain today (at rest), he did have some soreness in his L biceps following his last treatment session that has since improved. Reviewed pt's surgical notes/paperwork from Dr. Lovell Sheehan and provided handout for patient. Pt also brings in his hospital bracelet indicating he had ACDF surgery.  PERTINENT HISTORY:  hypertension, hyperlipidemia, diabetes  mellitus   PAIN:  Are you having pain? No  PRECAUTIONS: Other: unclear whether he still has surgical/ c-spine precautions.   WEIGHT BEARING RESTRICTIONS  unclear at this time  FALLS:  Has patient fallen in last 6 months? No  LIVING ENVIRONMENT: Lives with: lives alone Lives in: House/apartment Stairs: No Has following equipment at home: Single point cane and Environmental consultant - 2 wheeled  OCCUPATION: retired  PLOF: Independent  PATIENT GOALS "be able to lift my arm"    TODAY'S TREATMENT:   THER ACT: Reviewed  surgeon notes with patient regarding what surgery he had (ACDF) and that most recent xray shows his hardware is intact. Educated pt on unclear prognosis for his recovery at this point and that EMG will show if nerves remain intact but at this point it is difficult to determine a prognosis and therapy will continue to focus on increasing his ROM and strength as able.  THER EX: Supine AAROM shoulder flexion with dowel rod (adjusted HEP, new handout given). Pt unable to perform exercise in sitting without shoulder elevation compensation so removed seated shoulder flexion exercise and changed to performing it in supine position.  Modified plantigrade push-ups on countertop 3 x 10 reps for shoulder strengthening with mod verbal and tactile cues needed for correct body mechanics during exercise  Seated R/L lateral cervical flexion/UT stretch with poor ability to perform stretch due to forward head and tightness in cervical flexors. Pt transitioned to supine position on mat table. Encouraged pt to try and lay supine at home with decreased pillow support as he typically requires 3 pillows or one pillow with 2nd pillow folded in half for head support in supine position due to forward head. Pt reports that when in supine on 2 pillows he feels a stretch in anterior neck region. Performed PROM lateral cervical flexion L/R with significant muscle tightness detected and minimal ROM noted.   PATIENT  EDUCATION:  Education details: Continue HEP, work towards laying supine with decreased pillow support under head Person educated: Patient Education method: Programmer, multimedia, Facilities manager, Actor cues, Verbal cues, and Handouts Education comprehension: verbalized understanding, returned demonstration, verbal cues required, tactile cues required, and needs further education   HOME EXERCISE PROGRAM: Access Code: [email or web address] URL: [email or web address] Date: 01/07/2022 Prepared by: Waldon Merl  Exercises - Seated Passive Cervical Retraction  - 1-2 x daily - 5 x weekly - 1-2 sets - 5 reps - 5 hold - Seated Shoulder Abduction AAROM with Dowel  - 1-2 x daily - 5 x weekly - 1-2 sets - 5 reps - 5 hold - Seated Elbow Flexion AAROM  - 1 x daily - 5 x weekly - 3 sets - 10 reps - Seated Isometric Cervical Rotation  - 1 x daily - 5 x weekly - 1 sets - 10 reps - 2 seconds hold - Supine Shoulder Flexion with Dowel AAROM - Palms Up  - 1 x daily - 7 x weekly - 1 sets - 10 reps - 15 sec hold   ASSESSMENT:  CLINICAL IMPRESSION: Emphasis of skilled PT session on adjusting HEP for patient, working on shoulder strengthening in modified plantigrade position, and working on PROM of cervical region. Pt continues to exhibit decreased shoulder ROM, decreased cervical ROM, and decreased strength in LUE musculature. Pt continues to benefit from skilled therapy services to address ongoing weakness and ROM impairments. Continue POC.   OBJECTIVE IMPAIRMENTS decreased activity tolerance, decreased knowledge of condition, decreased mobility, decreased ROM, decreased strength, hypomobility, impaired tone, impaired UE functional use, and postural dysfunction.   ACTIVITY LIMITATIONS carrying, lifting, bathing, dressing, self feeding, reach over head, and hygiene/grooming  PARTICIPATION LIMITATIONS: meal prep, cleaning, laundry, driving, shopping, community activity, occupation, and yard work  PERSONAL FACTORS Age,  Fitness, Past/current experiences, and Time since onset of injury/illness/exacerbation are also affecting patient's functional outcome.   REHAB POTENTIAL: Fair time since onset  CLINICAL DECISION MAKING: Evolving/moderate complexity  EVALUATION COMPLEXITY: Moderate   GOALS: Goals reviewed with patient? Yes  SHORT TERM GOALS: Target date: 01/24/2022   Pt will be independent with initial  HEP for improved L UE strength   Baseline: to be provided Goal status: INITIAL  2.  Patient will improve QuickDASH score to </= 25 to show improved UE function Baseline: 31.8 Goal status: INITIAL    LONG TERM GOALS: Target date: 02/14/2022  Pt will be independent with final HEP for improved L UE strength and function  Baseline: to be provided Goal status: INITIAL  2.  Patient will improve QuickDASH score to </= 20 to demonstrate improved L UE strength and function Baseline: 31.8 Goal status: INITIAL     PLAN: PT FREQUENCY: 2x/week  PT DURATION: 6 weeks  PLANNED INTERVENTIONS: Therapeutic exercises, Therapeutic activity, Neuromuscular re-education, Balance training, Gait training, Patient/Family education, Self Care, Joint mobilization, Orthotic/Fit training, DME instructions, Electrical stimulation, Taping, Vasopneumatic device, Manual therapy, and Re-evaluation  PLAN FOR NEXT SESSION:  Modify HEP prn, continue PROM/AAROM of the shoulder/supination/elbow flexion, try weight-bearing through the arm in differing positions emphasizing most functional hand/wrist positions, continue stretches for neck, try out shoulder pulley and/or UE ranger with him--possibly recommend he buy a pulley    Excell Seltzer, PT, DPT, CSRS  01/18/22, 11:50 AM

## 2022-01-21 ENCOUNTER — Ambulatory Visit: Payer: Medicare Other

## 2022-01-21 DIAGNOSIS — R293 Abnormal posture: Secondary | ICD-10-CM | POA: Diagnosis not present

## 2022-01-21 DIAGNOSIS — M6281 Muscle weakness (generalized): Secondary | ICD-10-CM

## 2022-01-21 NOTE — Therapy (Signed)
OUTPATIENT PHYSICAL THERAPY CERVICAL TREATMENT   Patient Name: Maxwell Thomas MRN: [id number] DOB:[birth year], 76 y.o., male Today's Date: 01/21/2022   PT End of Session - 01/21/22 1225     Visit Number 6    Number of Visits 13    Date for PT Re-Evaluation 02/14/22    Authorization Type BCBS Medicare    Progress Note Due on Visit 10    PT Start Time 1230    PT Stop Time 1315    PT Time Calculation (min) 45 min    Activity Tolerance Patient tolerated treatment well    Behavior During Therapy WFL for tasks assessed/performed               Past Medical History:  Diagnosis Date   Hyperlipidemia    Hypertension    History reviewed. No pertinent surgical history. Patient Active Problem List   Diagnosis Date Noted   Gait abnormality 07/11/2021   Cervical myelopathy ([place]) 07/11/2021   Left arm weakness 07/11/2021   Diabetes ([place]) 04/12/2015   Nephrolithiasis 04/12/2015   Gallstone 04/12/2015   Essential hypertension 04/12/2015    PCP: L.Donnie Coffin, MD  REFERRING PROVIDER: Alric Ran, MD   REFERRING DIAG: G95.9 (ICD-10-CM) - Cervical myelopathy ([place]) M54.12 (ICD-10-CM) - Cervical radiculopathy R29.898 (ICD-10-CM) - Left arm weakness   THERAPY DIAG:  Muscle weakness (generalized)  Abnormal posture  Rationale for Evaluation and Treatment Rehabilitation  ONSET DATE: 01/01/2022 referral   SUBJECTIVE:                                                                                                                                                                                                         SUBJECTIVE STATEMENT: Patient reports doing well. Did remove 1 pillow from his bed to lay flatter. No falls/ near falls.   PERTINENT HISTORY:  hypertension, hyperlipidemia, diabetes mellitus   PAIN:  Are you having pain? No  PRECAUTIONS: Other: unclear whether he still has surgical/ c-spine precautions.   WEIGHT BEARING RESTRICTIONS  unclear at this  time  FALLS:  Has patient fallen in last 6 months? No  LIVING ENVIRONMENT: Lives with: lives alone Lives in: House/apartment Stairs: No Has following equipment at home: Single point cane and Environmental consultant - 2 wheeled  OCCUPATION: retired  PLOF: Independent  PATIENT GOALS "be able to lift my arm"    TODAY'S TREATMENT:   NMR: - gravity eliminated bicep activation at shoulder-height surface (compensating with pronation and bracioradialis activation    -added e-stim with minimal palpable contraction noted -e-stim to L deltoid- no palpable contraction felt  PATIENT EDUCATION:  Education details: brachial plexus anatomy, nerve healing process, button aid tool  Person educated: Patient Education method: Explanation, Demonstration, Tactile cues, Verbal cues, and Handouts Education comprehension: verbalized understanding, returned demonstration, verbal cues required, tactile cues required, and needs further education   HOME EXERCISE PROGRAM: Access Code: [email or web address] URL: [email or web address] Date: 01/07/2022 Prepared by: Waldon Merl  Exercises - Seated Passive Cervical Retraction  - 1-2 x daily - 5 x weekly - 1-2 sets - 5 reps - 5 hold - Seated Shoulder Abduction AAROM with Dowel  - 1-2 x daily - 5 x weekly - 1-2 sets - 5 reps - 5 hold - Seated Elbow Flexion AAROM  - 1 x daily - 5 x weekly - 3 sets - 10 reps - Seated Isometric Cervical Rotation  - 1 x daily - 5 x weekly - 1 sets - 10 reps - 2 seconds hold - Supine Shoulder Flexion with Dowel AAROM - Palms Up  - 1 x daily - 7 x weekly - 1 sets - 10 reps - 15 sec hold   ASSESSMENT:  CLINICAL IMPRESSION: Patient seen for skilled PT session with emphasis on L UE NMR. Patient reports feeling minimal improvement in overall L UE functioning. Discussed with patient neurosurgeon recommendation for f/u with L shoulder MRI to assess for brachial plexus injury. Patient continues to have significant compensatory L shoulder elevation  with essentially all movements and decreased tolerance to external cuing to minimize this compensation. Palpable bicep contraction felt, but no deltoid contraction felt with addition of e-stim. Continue POC.    OBJECTIVE IMPAIRMENTS decreased activity tolerance, decreased knowledge of condition, decreased mobility, decreased ROM, decreased strength, hypomobility, impaired tone, impaired UE functional use, and postural dysfunction.   ACTIVITY LIMITATIONS carrying, lifting, bathing, dressing, self feeding, reach over head, and hygiene/grooming  PARTICIPATION LIMITATIONS: meal prep, cleaning, laundry, driving, shopping, community activity, occupation, and yard work  PERSONAL FACTORS Age, Fitness, Past/current experiences, and Time since onset of injury/illness/exacerbation are also affecting patient's functional outcome.   REHAB POTENTIAL: Fair time since onset  CLINICAL DECISION MAKING: Evolving/moderate complexity  EVALUATION COMPLEXITY: Moderate   GOALS: Goals reviewed with patient? Yes  SHORT TERM GOALS: Target date: 01/24/2022   Pt will be independent with initial HEP for improved L UE strength   Baseline: to be provided Goal status: INITIAL  2.  Patient will improve QuickDASH score to </= 25 to show improved UE function Baseline: 31.8 Goal status: INITIAL    LONG TERM GOALS: Target date: 02/14/2022  Pt will be independent with final HEP for improved L UE strength and function  Baseline: to be provided Goal status: INITIAL  2.  Patient will improve QuickDASH score to </= 20 to demonstrate improved L UE strength and function Baseline: 31.8 Goal status: INITIAL     PLAN: PT FREQUENCY: 2x/week  PT DURATION: 6 weeks  PLANNED INTERVENTIONS: Therapeutic exercises, Therapeutic activity, Neuromuscular re-education, Balance training, Gait training, Patient/Family education, Self Care, Joint mobilization, Orthotic/Fit training, DME instructions, Electrical stimulation,  Taping, Vasopneumatic device, Manual therapy, and Re-evaluation  PLAN FOR NEXT SESSION:  Modify HEP prn, continue PROM/AAROM of the shoulder/supination/elbow flexion, try weight-bearing through the arm in differing positions emphasizing most functional hand/wrist positions, continue stretches for neck, continue trialing e-stim?     Westley Foots, PT, DPT, CBIS  01/21/22, 1:18 PM

## 2022-01-23 ENCOUNTER — Ambulatory Visit: Payer: Medicare Other

## 2022-01-23 DIAGNOSIS — R293 Abnormal posture: Secondary | ICD-10-CM

## 2022-01-23 DIAGNOSIS — M6281 Muscle weakness (generalized): Secondary | ICD-10-CM

## 2022-01-23 NOTE — Therapy (Signed)
OUTPATIENT PHYSICAL THERAPY CERVICAL TREATMENT   Patient Name: Maxwell Thomas MRN: [id number] DOB:November 19, 1945, 76 y.o., male Today's Date: 01/23/2022   PT End of Session - 01/23/22 1101     Visit Number 7    Number of Visits 13    Date for PT Re-Evaluation 02/14/22    Authorization Type BCBS Medicare    Progress Note Due on Visit 10    PT Start Time 1100    PT Stop Time 1145    PT Time Calculation (min) 45 min    Activity Tolerance Patient tolerated treatment well    Behavior During Therapy WFL for tasks assessed/performed             Past Medical History:  Diagnosis Date   Hyperlipidemia    Hypertension    History reviewed. No pertinent surgical history. Patient Active Problem List   Diagnosis Date Noted   Gait abnormality 07/11/2021   Cervical myelopathy ([place]) 07/11/2021   Left arm weakness 07/11/2021   Diabetes ([place]) 04/12/2015   Nephrolithiasis 04/12/2015   Gallstone 04/12/2015   Essential hypertension 04/12/2015    PCP: L.Donnie Coffin, MD  REFERRING PROVIDER: Alric Ran, MD   REFERRING DIAG: G95.9 (ICD-10-CM) - Cervical myelopathy ([place]) M54.12 (ICD-10-CM) - Cervical radiculopathy R29.898 (ICD-10-CM) - Left arm weakness   THERAPY DIAG:  Muscle weakness (generalized)  Abnormal posture  Rationale for Evaluation and Treatment Rehabilitation  ONSET DATE: 01/01/2022 referral   SUBJECTIVE:                                                                                                                                                                                                         SUBJECTIVE STATEMENT: Patient reports doing well. Exercises challenging, but going well. Did report some soreness in L bicep after e-stim last visit, but not terrible.   PERTINENT HISTORY:  hypertension, hyperlipidemia, diabetes mellitus   PAIN:  Are you having pain? No  PRECAUTIONS: Other: unclear whether he still has surgical/ c-spine precautions.   WEIGHT  BEARING RESTRICTIONS  unclear at this time  FALLS:  Has patient fallen in last 6 months? No   PATIENT GOALS "be able to lift my arm"    TODAY'S TREATMENT:   NMR: QuickDASH: 29 -gravity eliminated elbow flex/extend  -eccentric control elbow extension vs gravity  -pronation-> supination card flip  -brachialis hammer curl  -supine AAROM   -shoulder abduction  -shoulder flexion   -chest press with limited ROM achieved  -table slides for increased shoulder flexion   PATIENT EDUCATION:  Education details: continue HEP Person educated: Patient  Education method: Explanation, Demonstration, Tactile cues, Verbal cues, and Handouts Education comprehension: verbalized understanding, returned demonstration, verbal cues required, tactile cues required, and needs further education   HOME EXERCISE PROGRAM: Access Code: [email or web address] URL: [email or web address] Date: 01/07/2022 Prepared by: Oneita Kras  Exercises - Seated Passive Cervical Retraction  - 1-2 x daily - 5 x weekly - 1-2 sets - 5 reps - 5 hold - Seated Shoulder Abduction AAROM with Dowel  - 1-2 x daily - 5 x weekly - 1-2 sets - 5 reps - 5 hold - Seated Elbow Flexion AAROM  - 1 x daily - 5 x weekly - 3 sets - 10 reps - Seated Isometric Cervical Rotation  - 1 x daily - 5 x weekly - 1 sets - 10 reps - 2 seconds hold - Supine Shoulder Flexion with Dowel AAROM - Palms Up  - 1 x daily - 7 x weekly - 1 sets - 10 reps - 15 sec hold   ASSESSMENT:  CLINICAL IMPRESSION: Patient seen for skilled PT session with emphasis on L UE NMR. Patient noted to have improved bicep function- remains with no deltoid function. Continue with POC progressing AAROM-> AROM.    OBJECTIVE IMPAIRMENTS decreased activity tolerance, decreased knowledge of condition, decreased mobility, decreased ROM, decreased strength, hypomobility, impaired tone, impaired UE functional use, and postural dysfunction.   ACTIVITY LIMITATIONS carrying, lifting,  bathing, dressing, self feeding, reach over head, and hygiene/grooming  PARTICIPATION LIMITATIONS: meal prep, cleaning, laundry, driving, shopping, community activity, occupation, and yard work  PERSONAL FACTORS Age, Fitness, Past/current experiences, and Time since onset of injury/illness/exacerbation are also affecting patient's functional outcome.   REHAB POTENTIAL: Fair time since onset  CLINICAL DECISION MAKING: Evolving/moderate complexity  EVALUATION COMPLEXITY: Moderate   GOALS: Goals reviewed with patient? Yes  SHORT TERM GOALS: Target date: 01/24/2022   Pt will be independent with initial HEP for improved L UE strength   Baseline: to be provided; provided Goal status: MET  2.  Patient will improve QuickDASH score to </= 25 to show improved UE function Baseline: 31.8; 29 Goal status: IN PROGRESS    LONG TERM GOALS: Target date: 02/14/2022  Pt will be independent with final HEP for improved L UE strength and function  Baseline: to be provided Goal status: INITIAL  2.  Patient will improve QuickDASH score to </= 25 to demonstrate improved L UE strength and function Baseline: 31.8 Goal status: REVISED     PLAN: PT FREQUENCY: 2x/week  PT DURATION: 6 weeks  PLANNED INTERVENTIONS: Therapeutic exercises, Therapeutic activity, Neuromuscular re-education, Balance training, Gait training, Patient/Family education, Self Care, Joint mobilization, Orthotic/Fit training, DME instructions, Electrical stimulation, Taping, Vasopneumatic device, Manual therapy, and Re-evaluation  PLAN FOR NEXT SESSION:  Modify HEP prn, continue PROM/AAROM of the shoulder/supination/elbow flexion, try weight-bearing through the arm in differing positions emphasizing most functional hand/wrist positions, continue stretches for neck, continue trialing e-stim?     Debbora Dus, PT, DPT, CBIS  01/23/22, 11:48 AM

## 2022-01-28 ENCOUNTER — Ambulatory Visit: Payer: Medicare Other

## 2022-01-28 DIAGNOSIS — R293 Abnormal posture: Secondary | ICD-10-CM

## 2022-01-28 DIAGNOSIS — M6281 Muscle weakness (generalized): Secondary | ICD-10-CM | POA: Diagnosis not present

## 2022-01-28 NOTE — Therapy (Signed)
OUTPATIENT PHYSICAL THERAPY CERVICAL TREATMENT   Patient Name: Maxwell Thomas MRN: [id number] DOB:[birth year], 76 y.o., male Today's Date: 01/28/2022   PT End of Session - 01/28/22 1220     Visit Number 8    Number of Visits 13    Date for PT Re-Evaluation 02/14/22    Authorization Type BCBS Medicare    Progress Note Due on Visit 10    PT Start Time 1230    PT Stop Time 1315    PT Time Calculation (min) 45 min    Activity Tolerance Patient tolerated treatment well    Behavior During Therapy WFL for tasks assessed/performed             Past Medical History:  Diagnosis Date   Hyperlipidemia    Hypertension    No past surgical history on file. Patient Active Problem List   Diagnosis Date Noted   Gait abnormality 07/11/2021   Cervical myelopathy ([place]) 07/11/2021   Left arm weakness 07/11/2021   Diabetes ([place]) 04/12/2015   Nephrolithiasis 04/12/2015   Gallstone 04/12/2015   Essential hypertension 04/12/2015    PCP: L.Donnie Coffin, MD  REFERRING PROVIDER: Alric Ran, MD   REFERRING DIAG: G95.9 (ICD-10-CM) - Cervical myelopathy ([place]) M54.12 (ICD-10-CM) - Cervical radiculopathy R29.898 (ICD-10-CM) - Left arm weakness   THERAPY DIAG:  No diagnosis found.  Rationale for Evaluation and Treatment Rehabilitation  ONSET DATE: 01/01/2022 referral   SUBJECTIVE:                                                                                                                                                                                                         SUBJECTIVE STATEMENT: Patient reports doing well. Asking about lifting a weight with his L UE, PT recommending against this.   PERTINENT HISTORY:  hypertension, hyperlipidemia, diabetes mellitus   PAIN:  Are you having pain? No  PRECAUTIONS: Other: unclear whether he still has surgical/ c-spine precautions.   WEIGHT BEARING RESTRICTIONS  unclear at this time  FALLS:  Has patient fallen in last 6 months?  No   PATIENT GOALS "be able to lift my arm"    TODAY'S TREATMENT:   NMR: -supine AAROM   -shoulder flexion   -chest press with limited ROM achieved  -extensive conversation re: prognosis and different nerve injury types -UE ranger: shoulder flexion limited AROM, shoulder abduction, minimal AROM with no active delt (strong preference for compensatory shoulder shrug)  -table top gravity eliminated elbow flex/extend -table top pronation/supination  -discussed adding to HEP, but PT unable to find appropriate pictures at this  time   PATIENT EDUCATION:  Education details: continue HEP, nerve injury types and prognosis Person educated: Patient Education method: Explanation, Demonstration, Tactile cues, Verbal cues, and Handouts Education comprehension: verbalized understanding, returned demonstration, verbal cues required, tactile cues required, and needs further education   HOME EXERCISE PROGRAM: Access Code: [email or web address] URL: [email or web address] Date: 01/07/2022 Prepared by: Oneita Kras  Exercises - Seated Passive Cervical Retraction  - 1-2 x daily - 5 x weekly - 1-2 sets - 5 reps - 5 hold - Seated Shoulder Abduction AAROM with Dowel  - 1-2 x daily - 5 x weekly - 1-2 sets - 5 reps - 5 hold - Seated Elbow Flexion AAROM  - 1 x daily - 5 x weekly - 3 sets - 10 reps - Seated Isometric Cervical Rotation  - 1 x daily - 5 x weekly - 1 sets - 10 reps - 2 seconds hold - Supine Shoulder Flexion with Dowel AAROM - Palms Up  - 1 x daily - 7 x weekly - 1 sets - 10 reps - 15 sec hold   ASSESSMENT:  CLINICAL IMPRESSION: Patient seen for skilled PT session with emphasis on progressing general L UE strengthening. Patient still with multiple prognostic questions. Noted to have slight improvement in bicep strength, but minimal carryover to function. Remains with no deltoid activation. Continue POC.    OBJECTIVE IMPAIRMENTS decreased activity tolerance, decreased knowledge of condition,  decreased mobility, decreased ROM, decreased strength, hypomobility, impaired tone, impaired UE functional use, and postural dysfunction.   ACTIVITY LIMITATIONS carrying, lifting, bathing, dressing, self feeding, reach over head, and hygiene/grooming  PARTICIPATION LIMITATIONS: meal prep, cleaning, laundry, driving, shopping, community activity, occupation, and yard work  PERSONAL FACTORS Age, Fitness, Past/current experiences, and Time since onset of injury/illness/exacerbation are also affecting patient's functional outcome.   REHAB POTENTIAL: Fair time since onset  CLINICAL DECISION MAKING: Evolving/moderate complexity  EVALUATION COMPLEXITY: Moderate   GOALS: Goals reviewed with patient? Yes  SHORT TERM GOALS: Target date: 01/24/2022   Pt will be independent with initial HEP for improved L UE strength   Baseline: to be provided; provided Goal status: MET  2.  Patient will improve QuickDASH score to </= 25 to show improved UE function Baseline: 31.8; 29 Goal status: IN PROGRESS    LONG TERM GOALS: Target date: 02/14/2022  Pt will be independent with final HEP for improved L UE strength and function  Baseline: to be provided Goal status: INITIAL  2.  Patient will improve QuickDASH score to </= 25 to demonstrate improved L UE strength and function Baseline: 31.8 Goal status: REVISED     PLAN: PT FREQUENCY: 2x/week  PT DURATION: 6 weeks  PLANNED INTERVENTIONS: Therapeutic exercises, Therapeutic activity, Neuromuscular re-education, Balance training, Gait training, Patient/Family education, Self Care, Joint mobilization, Orthotic/Fit training, DME instructions, Electrical stimulation, Taping, Vasopneumatic device, Manual therapy, and Re-evaluation  PLAN FOR NEXT SESSION:  Modify HEP prn, continue PROM/AAROM of the shoulder/supination/elbow flexion, try weight-bearing through the arm in differing positions emphasizing most functional hand/wrist positions, continue  stretches for neck, continue trialing e-stim?     Debbora Dus, PT, DPT, CBIS 01/28/22, 1:19 PM

## 2022-01-30 ENCOUNTER — Ambulatory Visit: Payer: Medicare Other

## 2022-01-30 DIAGNOSIS — R293 Abnormal posture: Secondary | ICD-10-CM

## 2022-01-30 DIAGNOSIS — M6281 Muscle weakness (generalized): Secondary | ICD-10-CM | POA: Diagnosis not present

## 2022-01-30 NOTE — Therapy (Signed)
OUTPATIENT PHYSICAL THERAPY CERVICAL TREATMENT   Patient Name: Nechemia Chiappetta MRN: [id number] DOB:[birth year], 76 y.o., male Today's Date: 01/30/2022   PT End of Session - 01/30/22 1004     Visit Number 9    Number of Visits 13    Date for PT Re-Evaluation 02/14/22    Authorization Type BCBS Medicare    Progress Note Due on Visit 10    PT Start Time 1014    PT Stop Time 1100    PT Time Calculation (min) 46 min    Activity Tolerance Patient tolerated treatment well    Behavior During Therapy WFL for tasks assessed/performed             Past Medical History:  Diagnosis Date   Hyperlipidemia    Hypertension    History reviewed. No pertinent surgical history. Patient Active Problem List   Diagnosis Date Noted   Gait abnormality 07/11/2021   Cervical myelopathy ([place]) 07/11/2021   Left arm weakness 07/11/2021   Diabetes ([place]) 04/12/2015   Nephrolithiasis 04/12/2015   Gallstone 04/12/2015   Essential hypertension 04/12/2015    PCP: L.Donnie Coffin, MD  REFERRING PROVIDER: Alric Ran, MD   REFERRING DIAG: G95.9 (ICD-10-CM) - Cervical myelopathy ([place]) M54.12 (ICD-10-CM) - Cervical radiculopathy R29.898 (ICD-10-CM) - Left arm weakness   THERAPY DIAG:  Muscle weakness (generalized)  Abnormal posture  Rationale for Evaluation and Treatment Rehabilitation  ONSET DATE: 01/01/2022 referral   SUBJECTIVE:                                                                                                                                                                                                         SUBJECTIVE STATEMENT: Patient reports doing well. Does report some L arm soreness from the exercises. Denies falls/near falls.   PERTINENT HISTORY:  hypertension, hyperlipidemia, diabetes mellitus   PAIN:  Are you having pain? No  PRECAUTIONS: Other: unclear whether he still has surgical/ c-spine precautions.   WEIGHT BEARING RESTRICTIONS  unclear at this  time  FALLS:  Has patient fallen in last 6 months? No   PATIENT GOALS "be able to lift my arm"    TODAY'S TREATMENT:   NMR: -table top gravity eliminated elbow flex/extend -table top pronation/supination  -anterior ball roll out for relative L shoulder flexion -supine chest press -> serratus punch AAROM -supine shoulder flexion AAROM  -supine chin tucks  -prone L scapular adduction  -wall ball stability with L UE     PATIENT EDUCATION:  Education details: continue HEP Person educated: Patient Education method: Explanation, Demonstration, Tactile cues,  Verbal cues, and Handouts Education comprehension: verbalized understanding, returned demonstration, verbal cues required, tactile cues required, and needs further education   HOME EXERCISE PROGRAM: Access Code: [email or web address] URL: [email or web address] Date: 01/07/2022 Prepared by: Oneita Kras  Exercises - Seated Passive Cervical Retraction  - 1-2 x daily - 5 x weekly - 1-2 sets - 5 reps - 5 hold - Seated Shoulder Abduction AAROM with Dowel  - 1-2 x daily - 5 x weekly - 1-2 sets - 5 reps - 5 hold - Seated Elbow Flexion AAROM  - 1 x daily - 5 x weekly - 3 sets - 10 reps - Seated Isometric Cervical Rotation  - 1 x daily - 5 x weekly - 1 sets - 10 reps - 2 seconds hold - Supine Shoulder Flexion with Dowel AAROM - Palms Up  - 1 x daily - 7 x weekly - 1 sets - 10 reps - 15 sec hold - Seated Forearm Pronation and Supination AROM  - 1 x daily - 7 x weekly - 3 sets - 10 reps - Seated Flexion Stretch with Swiss Ball  - 1 x daily - 7 x weekly - 3 sets - 10 reps  ASSESSMENT:  CLINICAL IMPRESSION: Patient seen for skilled PT session with emphasis on progressing general L UE strengthening. Patient with some improvement in strength primarily in bicep, but not functional carryover to date. Continue POC.    OBJECTIVE IMPAIRMENTS decreased activity tolerance, decreased knowledge of condition, decreased mobility, decreased ROM,  decreased strength, hypomobility, impaired tone, impaired UE functional use, and postural dysfunction.   ACTIVITY LIMITATIONS carrying, lifting, bathing, dressing, self feeding, reach over head, and hygiene/grooming  PARTICIPATION LIMITATIONS: meal prep, cleaning, laundry, driving, shopping, community activity, occupation, and yard work  PERSONAL FACTORS Age, Fitness, Past/current experiences, and Time since onset of injury/illness/exacerbation are also affecting patient's functional outcome.   REHAB POTENTIAL: Fair time since onset  CLINICAL DECISION MAKING: Evolving/moderate complexity  EVALUATION COMPLEXITY: Moderate   GOALS: Goals reviewed with patient? Yes  SHORT TERM GOALS: Target date: 01/24/2022   Pt will be independent with initial HEP for improved L UE strength   Baseline: to be provided; provided Goal status: MET  2.  Patient will improve QuickDASH score to </= 25 to show improved UE function Baseline: 31.8; 29 Goal status: IN PROGRESS    LONG TERM GOALS: Target date: 02/14/2022  Pt will be independent with final HEP for improved L UE strength and function  Baseline: to be provided Goal status: INITIAL  2.  Patient will improve QuickDASH score to </= 25 to demonstrate improved L UE strength and function Baseline: 31.8 Goal status: REVISED     PLAN: PT FREQUENCY: 2x/week  PT DURATION: 6 weeks  PLANNED INTERVENTIONS: Therapeutic exercises, Therapeutic activity, Neuromuscular re-education, Balance training, Gait training, Patient/Family education, Self Care, Joint mobilization, Orthotic/Fit training, DME instructions, Electrical stimulation, Taping, Vasopneumatic device, Manual therapy, and Re-evaluation  PLAN FOR NEXT SESSION:  Modify HEP prn, continue PROM/AAROM of the shoulder/supination/elbow flexion, try weight-bearing through the arm in differing positions emphasizing most functional hand/wrist positions, continue stretches for neck, continue trialing  e-stim?     Debbora Dus, PT, DPT, CBIS 01/30/22, 11:43 AM

## 2022-02-04 ENCOUNTER — Ambulatory Visit: Payer: Medicare Other

## 2022-02-04 DIAGNOSIS — M6281 Muscle weakness (generalized): Secondary | ICD-10-CM | POA: Diagnosis not present

## 2022-02-04 DIAGNOSIS — R293 Abnormal posture: Secondary | ICD-10-CM

## 2022-02-04 NOTE — Therapy (Signed)
OUTPATIENT PHYSICAL THERAPY CERVICAL TREATMENT/ 10TH VISIT PROGRESS NOTE   Patient Name: Maxwell Thomas MRN: [id number] DOB:03-Aug-1945, 76 y.o., male Today's Date: 02/04/2022  Physical Therapy Progress Note   Dates of Reporting Period:01/03/22- 02/04/22  See Note below for Objective Data and Assessment of Progress/Goals.  Thank you for the referral of this patient. Debbora Dus, PT, DPT, CBIS    PT End of Session - 02/04/22 1102     Visit Number 10    Number of Visits 13    Date for PT Re-Evaluation 02/14/22    Authorization Type BCBS Medicare    Progress Note Due on Visit 10    PT Start Time 1100    PT Stop Time 1145    PT Time Calculation (min) 45 min    Activity Tolerance Patient tolerated treatment well    Behavior During Therapy WFL for tasks assessed/performed             Past Medical History:  Diagnosis Date   Hyperlipidemia    Hypertension    History reviewed. No pertinent surgical history. Patient Active Problem List   Diagnosis Date Noted   Gait abnormality 07/11/2021   Cervical myelopathy ([place]) 07/11/2021   Left arm weakness 07/11/2021   Diabetes ([place]) 04/12/2015   Nephrolithiasis 04/12/2015   Gallstone 04/12/2015   Essential hypertension 04/12/2015    PCP: L.Donnie Coffin, MD  REFERRING PROVIDER: Alric Ran, MD   REFERRING DIAG: G95.9 (ICD-10-CM) - Cervical myelopathy ([place]) M54.12 (ICD-10-CM) - Cervical radiculopathy R29.898 (ICD-10-CM) - Left arm weakness   THERAPY DIAG:  Muscle weakness (generalized)  Abnormal posture  Rationale for Evaluation and Treatment Rehabilitation  ONSET DATE: 01/01/2022 referral   SUBJECTIVE:                                                                                                                                                                                                         SUBJECTIVE STATEMENT: Patient reports doing the same. No major improvements. Denies falls/near falls.    PERTINENT HISTORY:  hypertension, hyperlipidemia, diabetes mellitus   PAIN:  Are you having pain? No  PRECAUTIONS: Other: unclear whether he still has surgical/ c-spine precautions.   WEIGHT BEARING RESTRICTIONS  unclear at this time  FALLS:  Has patient fallen in last 6 months? No   PATIENT GOALS "be able to lift my arm"    TODAY'S TREATMENT:  SELF CARE: -Discussed going on hold until results of EMG are back to determine justification for continuing PT as patient has not made any notable functional gains in 10 visits  -  PT prognosis as it pertains to EMG results  NMR: -gravity eliminated elbow flex/extend  -elbow flex + supinate + eccentric lowering  QuickDASH: 29 (unchanged from last assessment)    PATIENT EDUCATION:  Education details: continue HEP Person educated: Patient Education method: Explanation, Demonstration, Tactile cues, Verbal cues, and Handouts Education comprehension: verbalized understanding, returned demonstration, verbal cues required, tactile cues required, and needs further education   HOME EXERCISE PROGRAM: Access Code: [email or web address] URL: [email or web address] Date: 01/07/2022 Prepared by: Oneita Kras  Exercises - Seated Passive Cervical Retraction  - 1-2 x daily - 5 x weekly - 1-2 sets - 5 reps - 5 hold - Seated Shoulder Abduction AAROM with Dowel  - 1-2 x daily - 5 x weekly - 1-2 sets - 5 reps - 5 hold - Seated Elbow Flexion AAROM  - 1 x daily - 5 x weekly - 3 sets - 10 reps - Seated Isometric Cervical Rotation  - 1 x daily - 5 x weekly - 1 sets - 10 reps - 2 seconds hold - Supine Shoulder Flexion with Dowel AAROM - Palms Up  - 1 x daily - 7 x weekly - 1 sets - 10 reps - 15 sec hold - Seated Forearm Pronation and Supination AROM  - 1 x daily - 7 x weekly - 3 sets - 10 reps - Seated Flexion Stretch with Swiss Ball  - 1 x daily - 7 x weekly - 3 sets - 10 reps  ASSESSMENT:  CLINICAL IMPRESSION: Patient seen for skilled PT session with  emphasis on goal assessment and discussion regarding going on hold pending EMG results. Patient with no notable functional improvements in LUE. Extensive conversation regarding need to justify continuing PT without improvements seen. Discussed that if EMG shows continued communication with muscles then we can continue to strengthen the appropriate muscle groups, but if there is no communication with the muscles, then there is no chance at strengthening the muscle groups. Patient partly receptive to education. Patient to return in a month after EMG completed.    OBJECTIVE IMPAIRMENTS decreased activity tolerance, decreased knowledge of condition, decreased mobility, decreased ROM, decreased strength, hypomobility, impaired tone, impaired UE functional use, and postural dysfunction.   ACTIVITY LIMITATIONS carrying, lifting, bathing, dressing, self feeding, reach over head, and hygiene/grooming  PARTICIPATION LIMITATIONS: meal prep, cleaning, laundry, driving, shopping, community activity, occupation, and yard work  PERSONAL FACTORS Age, Fitness, Past/current experiences, and Time since onset of injury/illness/exacerbation are also affecting patient's functional outcome.   REHAB POTENTIAL: Fair time since onset  CLINICAL DECISION MAKING: Evolving/moderate complexity  EVALUATION COMPLEXITY: Moderate   GOALS: Goals reviewed with patient? Yes  SHORT TERM GOALS: Target date: 01/24/2022   Pt will be independent with initial HEP for improved L UE strength   Baseline: to be provided; provided Goal status: MET  2.  Patient will improve QuickDASH score to </= 25 to show improved UE function Baseline: 31.8; 29 Goal status: IN PROGRESS    LONG TERM GOALS: Target date: 02/14/2022  Pt will be independent with final HEP for improved L UE strength and function  Baseline: to be provided; provided Goal status: NOT MET  2.  Patient will improve QuickDASH score to </= 25 to demonstrate improved L UE  strength and function Baseline: 31.8; 29 Goal status: NOT MET     PLAN: PT FREQUENCY: 2x/week  PT DURATION: 6 weeks  PLANNED INTERVENTIONS: Therapeutic exercises, Therapeutic activity, Neuromuscular re-education, Balance training, Gait training, Patient/Family education, Self Care, Joint mobilization, Orthotic/Fit training, DME  instructions, Electrical stimulation, Taping, Vasopneumatic device, Manual therapy, and Re-evaluation  PLAN FOR NEXT SESSION:  EMG results? Gains in the past month?    Debbora Dus, PT, DPT, CBIS 02/04/22, 12:02 PM

## 2022-02-05 DIAGNOSIS — L578 Other skin changes due to chronic exposure to nonionizing radiation: Secondary | ICD-10-CM | POA: Diagnosis not present

## 2022-02-05 DIAGNOSIS — C44319 Basal cell carcinoma of skin of other parts of face: Secondary | ICD-10-CM | POA: Diagnosis not present

## 2022-02-05 DIAGNOSIS — L910 Hypertrophic scar: Secondary | ICD-10-CM | POA: Diagnosis not present

## 2022-02-05 DIAGNOSIS — L821 Other seborrheic keratosis: Secondary | ICD-10-CM | POA: Diagnosis not present

## 2022-02-06 ENCOUNTER — Ambulatory Visit: Payer: Medicare Other | Admitting: Physical Therapy

## 2022-02-11 ENCOUNTER — Ambulatory Visit: Payer: Medicare Other

## 2022-02-12 DIAGNOSIS — I10 Essential (primary) hypertension: Secondary | ICD-10-CM | POA: Diagnosis not present

## 2022-02-12 DIAGNOSIS — E1140 Type 2 diabetes mellitus with diabetic neuropathy, unspecified: Secondary | ICD-10-CM | POA: Diagnosis not present

## 2022-02-12 DIAGNOSIS — Z0000 Encounter for general adult medical examination without abnormal findings: Secondary | ICD-10-CM | POA: Diagnosis not present

## 2022-02-12 DIAGNOSIS — E782 Mixed hyperlipidemia: Secondary | ICD-10-CM | POA: Diagnosis not present

## 2022-02-12 DIAGNOSIS — R634 Abnormal weight loss: Secondary | ICD-10-CM | POA: Diagnosis not present

## 2022-02-13 ENCOUNTER — Ambulatory Visit: Payer: Medicare Other | Admitting: Physical Therapy

## 2022-03-06 ENCOUNTER — Ambulatory Visit: Payer: Medicare Other | Admitting: Neurology

## 2022-03-06 ENCOUNTER — Encounter: Payer: Self-pay | Admitting: Neurology

## 2022-03-06 VITALS — BP 144/77 | HR 72 | Ht 69.5 in | Wt 158.0 lb

## 2022-03-06 DIAGNOSIS — R269 Unspecified abnormalities of gait and mobility: Secondary | ICD-10-CM

## 2022-03-06 DIAGNOSIS — M5412 Radiculopathy, cervical region: Secondary | ICD-10-CM

## 2022-03-06 DIAGNOSIS — G959 Disease of spinal cord, unspecified: Secondary | ICD-10-CM

## 2022-03-06 MED ORDER — DULOXETINE HCL 30 MG PO CPEP: 30.0000 mg | ORAL_CAPSULE | Freq: Every day | ORAL | 0 refills | Status: AC

## 2022-03-06 MED ORDER — DULOXETINE HCL 60 MG PO CPEP: 60.0000 mg | ORAL_CAPSULE | Freq: Every day | ORAL | 11 refills | Status: AC

## 2022-03-06 NOTE — Progress Notes (Signed)
ASSESSMENT AND PLAN  [name] is a 76 y.o. male  Status post cervical decompression surgery for left cervical radiculopathy, myelopathy,  With persistent left arm weakness,  Repeat EMG nerve conduction study March 06, 2022 showed no significant change definitely no worsening compared to presurgical level in April 2023,  This can be related to his severe left more than right by foraminal narrowing, canal stenosis at C4-5, C5-6 level prior to surgical level, likely with persistent myelomalacia/radiculopathy changes,  Will have neurosurgical follow-up, repeat imaging study,  Muscle achy pain, add on Cymbalta titrating to 60 mg daily  Lumbar spondylosis  Only mild chronic neuropathic changes, no significant leg weakness,  Conservative treatment now   DIAGNOSTIC DATA (LABS, IMAGING, TESTING) - I reviewed patient records, labs, notes, testing and imaging myself where available.   MEDICAL HISTORY:  Maxwell Thomas is a 76 year old right-handed male, referred by Dr. April Manson, for EMG nerve conduction study, evaluation of left arm and leg weakness.  His primary care physician is Dr. Donnie Coffin  I reviewed and summarized the referring note.  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, C5-6, severe bilateral foraminal narrowing at C4-5, severe left and mild right foraminal narrowing C5-6  He has pending neurosurgical evaluation on Jul 31, 2021,  MRI of brain showed mild atrophy, small vessel disease no acute abnormality  UPDATE Mar 06 2022: He underwent cervical decompression surgery in June 2023, did help his neck pain some, but still have neck stiffness, very disappointed that his left arm remains weak, he denies significant low back pain, denies bowel or bladder incontinence  He has pending appointment with neurosurgeon in few weeks  Personally reviewed MRI of lumbar spine diffuse lumbar spondylosis, no significant canal stenosis, variable degree of foraminal narrowing,  He was referred by Dr. April Manson for repeat EMG nerve conduction study today, which showed evidence of active left C5, 6, 7 more than C8-T1 radiculopathy, mild right C5-6-7 chronic radiculopathy, mild bilateral lumbosacral radiculopathy.  He denies bulbar weakness, no double vision,  We again personally reviewed presurgical MRI of cervical spine in detail, severe spinal stenosis C4-5, C5-6, severe bilateral neuroforaminal narrowing, worsening on the left side  PHYSICAL EXAM:    PHYSICAL EXAMNIATION:  Gen: NAD, conversant, well nourised, well groomed                     Cardiovascular: Regular rate rhythm, no peripheral edema, warm, nontender. Eyes: Conjunctivae clear without exudates or hemorrhage Neck: Supple, no carotid bruits. Pulmonary: Clear to auscultation bilaterally  NEUROLOGICAL EXAM:  MENTAL STATUS: Speech/cognition: Awake, alert, oriented to history taking and care conversation  CRANIAL NERVES: CN II: Visual fields are full to confrontation. Pupils are  round equal and briskly reactive to light. CN III, IV, VI: extraocular movement are normal. No ptosis. CN V: Facial sensation is intact to light touch CN VII: Face is symmetric with normal eye closure  CN VIII: Hearing is normal to causal conversation. CN IX, X: Phonation is normal. CN XI: Head turning and shoulder shrug are intact  MOTOR:  UE Shoulder Abduction Shoulder External Rotation Elbow Flexion Elbow  Extension Pronation Supination Wrist Flexion Wrist Extension Grip Finger  Abduction Finger Flexion /Extension  R 5 5 5 5 5 5 5 5 5 5  5/5  L 3 3 3 4 4 3 5 5 5  5- 5/5   Bilateral lower extremity muscle showed no significant weakness  REFLEXES: Absent bilateral biceps reflex, trace right brachioradialis and triceps, absent on the left side, brisk bilateral patellar reflex, bilateral Babinski signs  SENSORY: Length-dependent decreased light touch pinprick vibratory sensation to above ankle level  COORDINATION: There is no trunk or limb dysmetria noted.  GAIT/STANCE: need to push up, mildly unsteady, decreased swing of left arm due to proximal left arm weakness  REVIEW OF SYSTEMS:  Full 14 system review of systems performed and notable only for as above All other review of systems were negative.   ALLERGIES: No Known Allergies  HOME MEDICATIONS: Current Outpatient Medications  Medication Sig Dispense Refill   hydrochlorothiazide (HYDRODIURIL) 25 MG tablet Take 25 mg by mouth daily.     metFORMIN (GLUCOPHAGE) 500 MG tablet Take 500 mg by mouth at bedtime.     metoprolol succinate (TOPROL-XL) 50 MG 24 hr tablet Take 50 mg by mouth daily. Take with or immediately following a meal.     rosuvastatin (CRESTOR) 10 MG tablet Take 10 mg by mouth daily.     No current facility-administered medications for this visit.    PAST MEDICAL HISTORY: Past Medical History:  Diagnosis Date   Hyperlipidemia    Hypertension     PAST SURGICAL HISTORY: No past surgical history on  file.  FAMILY HISTORY: Family History  Problem Relation Age of Onset   Alzheimer's disease Mother    Heart attack Father    Parkinson's disease Brother     SOCIAL HISTORY: Social History   Socioeconomic History   Marital status: Widowed    Spouse name: Not on file   Number of children: Not on file   Years of education: Not on file   Highest education level: Not on file  Occupational History   Not on file  Tobacco Use   Smoking status: Never   Smokeless tobacco: Not on file  Substance and Sexual Activity   Alcohol use: No    Alcohol/week: 0.0 standard drinks of alcohol   Drug use: No   Sexual activity: Not on file  Other Topics Concern   Not on file  Social History Narrative   Not on file   Social Determinants of Health   Financial Resource Strain: Not on file  Food Insecurity: Not on file  Transportation Needs: Not on file  Physical Activity: Not on file  Stress: Not on file  Social Connections: Not on file  Intimate Partner Violence: Not on file      , M.D. Ph.D.  [facility] [address], [address] [place], [address] [place] Ph: [phone] Fax: [phone]  CC:  [phone],  L.Dean, MD [place] [employer] [place],  [place] [address]  Alroy Dust, L.Marlou Sa, MD

## 2022-03-06 NOTE — Procedures (Signed)
Full Name: Maxwell Thomas Gender: Male MRN #: [id number] Date of Birth: April 01, 1945    Visit Date: 03/06/2022 10:54 Age: 76 Years Examining Physician: Dr. Levert Feinstein Referring Physician: Dr. Windell [place] Height: 5 feet 9 inch History: 76 year old male with history of cervical decompression surgery for  left cervical radiculopathy with myelopathy, presenting with persistent left arm weakness,  Summary of the test: Nerve conduction study: Left median and ulnar motor responses all showed mildly prolonged distal latency, with well-preserved CMAP amplitude, this can be related to her cold limb temperature  Left median sensory response showed mildly prolonged peak latency with moderately decreased snap amplitude.  Left ulnar sensory responses showed normal peak latency, snap amplitude  Right sural sensory responses were present with mildly prolonged peak latency and decreased snap amplitude, right superficial peroneal sensory responses was not able to elicit, this can be due to technical difficulties due to mild bilateral lower extremity edema, cold limb temperature  Right tibial motor responses showed mildly prolonged peak latency, moderately decreased CMAP amplitude, with well-preserved conduction velocity and F-wave latency.  Electromyography: Selected needle examinations were performed at bilateral lower extremity muscles, lumbosacral paraspinal muscles, bilateral upper extremity muscles and cervical paraspinal muscles.  There is evidence of active neuropathic changes involving left C5, 6, 7 more than C8-T1 myotomes.  There is much mild chronic neuropathic changes involving right C5-7 myotomes.  There is also evidence of increased insertional activity at left lower cervical paraspinal muscles.  There is mild chronic neuropathic changes involving bilateral L4-5 S1 myotomes.  There is no active denervation seen involving bilateral lumbosacral paraspinal  muscles.   Conclusion: This is an abnormal study.  There is electrodiagnostic evidence of active and chronic left C5-6-7 more than C8, T1 cervical radiculopathy.  There is also evidence of chronic right cervical, bilateral lumbosacral radiculopathy.  Compared to previous study in April 2023, prior to his cervical decompression, there was no significant change.  The variations in multiple nerve conduction study can be related to his cold limb temperature, and mild lower extremity pitting edema    ------------------------------- Levert Feinstein, M.D. Ph.D.  [facility] [address] [place], [place] [address] Tel: [phone] Fax: [phone]  Verbal informed consent was obtained from the patient, patient was informed of potential risk of procedure, including bruising, bleeding, hematoma formation, infection, muscle weakness, muscle pain, numbness, among others.        MNC    Nerve / Sites Muscle Latency Ref. Amplitude Ref. Rel Amp Segments Distance Velocity Ref. Area    ms ms mV mV %  cm m/s m/s mVms  L Median - APB     Wrist APB 4.4 ?4.4 5.8 ?4.0 100 Wrist - APB 7   24.9     Upper arm APB 8.9  5.5  95.1 Upper arm - Wrist 21 47 ?49 21.4  L Ulnar - ADM     Wrist ADM 3.9 ?3.3 11.5 ?6.0 100 Wrist - ADM 7   38.2     B.Elbow ADM 6.9  8.9  77.2 B.Elbow - Wrist 17 57 ?49 29.2     A.Elbow ADM 8.6  8.8  98.4 A.Elbow - B.Elbow 12 69 ?49 30.6  R Tibial - AH     Ankle AH 7.8 ?5.8 2.2 ?4.0 100 Ankle - AH 9   7.8     Pop fossa AH 18.6  2.0  88.2 Pop fossa - Ankle 46 42 ?41 6.5  SNC    Nerve / Sites Rec. Site Peak Lat Ref.  Amp Ref. Segments Distance    ms ms V V  cm  R Sural - Ankle (Calf)     Calf Ankle 4.7 ?4.4 4 ?6 Calf - Ankle 14  R Superficial peroneal - Ankle     Lat leg Ankle NR ?4.4 NR ?6 Lat leg - Ankle 14  L Median - Orthodromic (Dig II, Mid palm)     Dig II Wrist 4.0 ?3.4 4 ?10 Dig II - Wrist 13  L Ulnar - Orthodromic, (Dig V, Mid palm)      Dig V Wrist 3.0 ?3.1 5 ?5 Dig V - Wrist 52             F  Wave    Nerve F Lat Ref.   ms ms  L Ulnar - ADM 31.5 ?32.0  R Tibial - AH 56.8 ?56.0         EMG Summary Table    Spontaneous MUAP Recruitment  Muscle IA Fib PSW Fasc Other Amp Dur. Poly Pattern  R. First dorsal interosseous Normal None None None _______ Normal Normal Normal Reduced  R. Brachioradialis Normal None None None _______ Normal Normal Normal Reduced  R. Biceps brachii Normal None None None _______ Normal Normal Normal Reduced  R. Deltoid Normal None None None _______ Increased Increased 1+ Reduced  R. Triceps brachii Normal None None None _______ Increased Increased 1+ Reduced  L. First dorsal interosseous Normal None None None _______ Normal Normal Normal Reduced  L. Flexor digitorum profundus (Ulnar) Normal None None None _______ Increased Increased 1+ Reduced  L. Biceps brachii Increased 1+ 1+ None _______ Increased Increased 1+ Reduced  L. Deltoid Increased 1+ None None _______ Increased Increased 1+ Reduced  L. Triceps brachii Increased None None None _______ Increased Increased 1+ Reduced  L. Pronator teres Increased None None None _______ Increased Increased 1+ Reduced  L. Brachioradialis Increased None None None _______ Increased Increased 1+ Reduced  L. Extensor digitorum communis Increased None None None _______ Increased Increased 1+ Reduced  L. Flexor carpi radialis Increased None None None _______ Increased Increased 1+ Reduced  L. Cervical paraspinals Increased 1+ None None _______ Increased Increased 1+ Normal  R. Tibialis anterior Normal None None None _______ Normal Normal Normal Reduced  R. Tibialis posterior Normal None None None _______ Normal Normal Normal Reduced  R. Gastrocnemius (Medial head) Normal None None None _______ Normal Normal Normal Reduced  R. Vastus lateralis Normal None None None _______ Normal Normal Normal Reduced  L. Tibialis anterior Normal None None None _______ Normal  Normal Normal Reduced  L. Peroneus longus Normal None None None _______ Normal Normal Normal Reduced  L. Gastrocnemius (Medial head) Normal None None None _______ Normal Normal Normal Reduced  L. Vastus lateralis Normal None None None _______ Normal Normal Normal Reduced  R. Lumbar paraspinals (low) Normal None None None _______ Normal Normal Normal Normal  R. Lumbar paraspinals (mid) Normal None None None _______ Normal Normal Normal Normal  L. Lumbar paraspinals (low) Normal None None None _______ Normal Normal Normal Normal  L. Lumbar paraspinals (mid) Normal None None None _______ Normal Normal Normal Normal

## 2022-03-06 NOTE — Patient Instructions (Signed)
Meds ordered this encounter  Medications   DULoxetine (CYMBALTA) 60 MG capsule    Sig: Take 1 capsule (60 mg total) by mouth daily.    Dispense:  30 capsule    Refill:  11   DULoxetine (CYMBALTA) 30 MG capsule    Sig: Take 1 capsule (30 mg total) by mouth daily. Refill cymbalta 30mg  daily first, before 60mg     Dispense:  30 capsule    Refill:  0

## 2022-03-07 ENCOUNTER — Ambulatory Visit: Payer: Medicare Other | Attending: Family Medicine

## 2022-03-07 DIAGNOSIS — M6281 Muscle weakness (generalized): Secondary | ICD-10-CM | POA: Diagnosis not present

## 2022-03-07 DIAGNOSIS — R293 Abnormal posture: Secondary | ICD-10-CM | POA: Insufficient documentation

## 2022-03-07 NOTE — Therapy (Signed)
OUTPATIENT PHYSICAL THERAPY CERVICAL TREATMENT/ RE-CERT/ DISCHARGE SUMMARY    Patient Name: Maxwell Thomas MRN: [id number] DOB:12-06-1945, 75 y.o., male Today's Date: 03/07/2022  PHYSICAL THERAPY DISCHARGE SUMMARY  Visits from Start of Care: 11  Current functional level related to goals / functional outcomes: Remains with non-functional proximal L UE    Remaining deficits: See eval    Education / Equipment: PT POC, HEP, rehab prognosis, functional implications of EMG results   Patient agrees to discharge. Patient goals were not met. Patient is being discharged due to lack of progress.     PT End of Session - 03/07/22 1019     Visit Number 11    Number of Visits 13    Date for PT Re-Evaluation 02/14/22    Authorization Type BCBS Medicare    Progress Note Due on Visit 20    PT Start Time 1016    PT Stop Time 1100    PT Time Calculation (min) 44 min    Activity Tolerance Patient tolerated treatment well    Behavior During Therapy WFL for tasks assessed/performed             Past Medical History:  Diagnosis Date   Hyperlipidemia    Hypertension    History reviewed. No pertinent surgical history. Patient Active Problem List   Diagnosis Date Noted   Cervical radiculopathy 03/06/2022   Gait abnormality 07/11/2021   Cervical myelopathy ([place]) 07/11/2021   Left arm weakness 07/11/2021   Diabetes ([place]) 04/12/2015   Nephrolithiasis 04/12/2015   Gallstone 04/12/2015   Essential hypertension 04/12/2015    PCP: L.Donnie Coffin, MD  REFERRING PROVIDER: Alric Ran, MD   REFERRING DIAG: G95.9 (ICD-10-CM) - Cervical myelopathy ([place]) M54.12 (ICD-10-CM) - Cervical radiculopathy R29.898 (ICD-10-CM) - Left arm weakness   THERAPY DIAG:  Muscle weakness (generalized)  Abnormal posture  Rationale for Evaluation and Treatment Rehabilitation  ONSET DATE: 01/01/2022 referral   SUBJECTIVE:                                                                                                                                                                                                          SUBJECTIVE STATEMENT: Patient reports doing the same. No major improvements. Denies falls/near falls. HAD EMG yesterday and is discouraged by the results.   PERTINENT HISTORY:  hypertension, hyperlipidemia, diabetes mellitus   PAIN:  Are you having pain? No  PRECAUTIONS: Other: unclear whether he still has surgical/ c-spine precautions.   WEIGHT BEARING RESTRICTIONS  unclear at this time  FALLS:  Has patient fallen in last 6  months? No   PATIENT GOALS "be able to lift my arm"    TODAY'S TREATMENT:  SELF CARE: -PT prognosis  -functional implications of EMG results  -potential need for future bracing given potential progression to L shoulder subluxation   PATIENT EDUCATION:  Education details: dc from PT Person educated: Patient Education method: Explanation, Demonstration, Tactile cues, Verbal cues, and Handouts Education comprehension: verbalized understanding, returned demonstration, verbal cues required, tactile cues required, and needs further education   HOME EXERCISE PROGRAM: Access Code: [email or web address] URL: [email or web address] Date: 01/07/2022 Prepared by: Oneita Kras  Exercises - Seated Passive Cervical Retraction  - 1-2 x daily - 5 x weekly - 1-2 sets - 5 reps - 5 hold - Seated Shoulder Abduction AAROM with Dowel  - 1-2 x daily - 5 x weekly - 1-2 sets - 5 reps - 5 hold - Seated Elbow Flexion AAROM  - 1 x daily - 5 x weekly - 3 sets - 10 reps - Seated Isometric Cervical Rotation  - 1 x daily - 5 x weekly - 1 sets - 10 reps - 2 seconds hold - Supine Shoulder Flexion with Dowel AAROM - Palms Up  - 1 x daily - 7 x weekly - 1 sets - 10 reps - 15 sec hold - Seated Forearm Pronation and Supination AROM  - 1 x daily - 7 x weekly - 3 sets - 10 reps - Seated Flexion Stretch with Swiss Ball  - 1 x daily - 7 x weekly - 3 sets - 10  reps  ASSESSMENT:  CLINICAL IMPRESSION: Patient seen for skilled PT session with emphasis on patient education. Patient remains with essentially same function as a month ago at last visit. L proximal UE is non-functional. Patient remains with many questions regarding medical prognosis- PT deferring to MD/surgeon, what his L UE will look like, functional prognosis. PT explaining muscle atrophy as a "use it or lose it" phenomenon. Patient currently without pain, but PT is concerned about potential development of subluxation given weakness in deltoid. Patient to dc from PT due to lack of progress.    OBJECTIVE IMPAIRMENTS decreased activity tolerance, decreased knowledge of condition, decreased mobility, decreased ROM, decreased strength, hypomobility, impaired tone, impaired UE functional use, and postural dysfunction.   ACTIVITY LIMITATIONS carrying, lifting, bathing, dressing, self feeding, reach over head, and hygiene/grooming  PARTICIPATION LIMITATIONS: meal prep, cleaning, laundry, driving, shopping, community activity, occupation, and yard work  PERSONAL FACTORS Age, Fitness, Past/current experiences, and Time since onset of injury/illness/exacerbation are also affecting patient's functional outcome.   REHAB POTENTIAL: Fair time since onset  CLINICAL DECISION MAKING: Evolving/moderate complexity  EVALUATION COMPLEXITY: Moderate   GOALS: Goals reviewed with patient? Yes  SHORT TERM GOALS: Target date: 01/24/2022   Pt will be independent with initial HEP for improved L UE strength   Baseline: to be provided; provided Goal status: MET  2.  Patient will improve QuickDASH score to </= 25 to show improved UE function Baseline: 31.8; 29 Goal status: IN PROGRESS    LONG TERM GOALS: Target date: 02/14/2022  Pt will be independent with final HEP for improved L UE strength and function  Baseline: to be provided; provided Goal status: NOT MET  2.  Patient will improve QuickDASH  score to </= 25 to demonstrate improved L UE strength and function Baseline: 31.8; 29 Goal status: NOT MET     PLAN: PT FREQUENCY: 2x/week  PT DURATION: 6 weeks  PLANNED INTERVENTIONS: Therapeutic exercises, Therapeutic activity, Neuromuscular re-education, Balance training, Gait  training, Patient/Family education, Self Care, Joint mobilization, Orthotic/Fit training, DME instructions, Electrical stimulation, Taping, Vasopneumatic device, Manual therapy, and Re-evaluation  PLAN FOR NEXT SESSION:  dc from PT    Debbora Dus, PT, DPT, CBIS 03/07/22, 11:13 AM

## 2022-03-07 NOTE — Progress Notes (Signed)
Hi Jennifer,   Here are the results of the repeat EMG/NCS.   Thank you for your assistance.

## 2022-03-22 DIAGNOSIS — M4712 Other spondylosis with myelopathy, cervical region: Secondary | ICD-10-CM | POA: Diagnosis not present

## 2022-04-16 ENCOUNTER — Encounter: Payer: Self-pay | Admitting: Neurology

## 2022-04-16 ENCOUNTER — Ambulatory Visit: Payer: Medicare Other | Admitting: Neurology

## 2022-04-16 VITALS — BP 129/76 | HR 111 | Ht 69.0 in | Wt 158.0 lb

## 2022-04-16 DIAGNOSIS — G959 Disease of spinal cord, unspecified: Secondary | ICD-10-CM | POA: Diagnosis not present

## 2022-04-16 DIAGNOSIS — M542 Cervicalgia: Secondary | ICD-10-CM

## 2022-04-16 DIAGNOSIS — M436 Torticollis: Secondary | ICD-10-CM | POA: Diagnosis not present

## 2022-04-16 DIAGNOSIS — M5412 Radiculopathy, cervical region: Secondary | ICD-10-CM | POA: Diagnosis not present

## 2022-04-16 DIAGNOSIS — R29898 Other symptoms and signs involving the musculoskeletal system: Secondary | ICD-10-CM

## 2022-04-16 NOTE — Patient Instructions (Signed)
Start tizanidine as needed for neck stiffness Referral to PT for neck stiffness Continue to follow-up with your doctors Return as needed

## 2022-04-16 NOTE — Progress Notes (Signed)
[facility]  PATIENT: Maxwell Thomas DOB: 10-05-1945  REQUESTING CLINICIAN: Alroy Dust, L.Dean, MD HISTORY FROM: Patient  REASON FOR VISIT: Left arm weakness    HISTORICAL  CHIEF COMPLAINT:  Chief Complaint  Patient presents with   Follow-up    Rm 15, alone  Reports no changes, would like to discuss Cymbalta and tizanidine, states he has not stared medications    INTERVAL HISTORY 04/16/2022:  Patient presents today for follow-up, last visit was in October.  Since then, he has repeated his EMG in December which showed no improvement from the prior EMG.  He did completed physical therapy, could not see any changes in his left arm weakness, hence discharged from PT.  He continues to follow-up with Dr. Arnoldo Morale, neurosurgery will see him again in July. He was complaining of some neck stiffness, prescribed on tizanidine but has not started the medication.  Overall no changes.   INTERVAL HISTORY 01/01/2022:  Patient presents today for follow-up, last visit was in April.  Since that visit, he did have the EMG nerve conduction study which showed evidence of active left cervical radiculopathy.  He did follow-up with surgery and had cervical surgery back in June.  He recovered very well from the surgery standpoint but has not started physical therapy.  He reported that the left arm weakness is still the same, he has not noted any changes. Denies any pain, states that the left arm is just weak mainly at the shoulder level.    HISTORY OF PRESENT ILLNESS:  This is a 77 year old gentleman past medical history of hypertension, hyperlipidemia, diabetes mellitus who is presenting with complaint of left arm weakness and right leg weakness.  Patient reports that his symptoms started on March 15 after working on the roof, going up and down a ladder.  Initially started with left arm weakness and right leg weakness, sometimes he will feel that the right right leg will give out.  He started  using a cane, the weakness continued for 2 to 3 weeks and improve to the point that he is not using the cane anymore.   When he comes to the left arm the symptoms worsen now to the point that he cannot pick up the left arm.  He is very weak at the shoulder level, he still have a good grip but unable to abduct the left arm.  When he presented to the ED 3 weeks after the symptoms started, he did have MRI brain and cervical spine.  The MRI brain did not show any acute abnormality but the MRI cervical spine showed severe canal stenosis at C4 5 and  C5-6.  Patient was referred to neurosurgery, Dr. Arnoldo Morale for which he had an appointment next month on the 17th.  He denies any pain, denies any numbness, no tingling, states that his only complaint is weakness.   OTHER MEDICAL CONDITIONS: Hypertension, Hyperlipidemia, Diabetes    REVIEW OF SYSTEMS: Full 14 system review of systems performed and negative with exception of: as noted in the HPI   ALLERGIES: No Known Allergies  HOME MEDICATIONS: Outpatient Medications Prior to Visit  Medication Sig Dispense Refill   hydrochlorothiazide (HYDRODIURIL) 25 MG tablet Take 25 mg by mouth daily.     metFORMIN (GLUCOPHAGE) 500 MG tablet Take 500 mg by mouth at bedtime.     metoprolol succinate (TOPROL-XL) 50 MG 24 hr tablet Take 50 mg by mouth daily. Take with or immediately following a meal.     rosuvastatin (CRESTOR) 10 MG  tablet Take 10 mg by mouth daily.     DULoxetine (CYMBALTA) 30 MG capsule Take 1 capsule (30 mg total) by mouth daily. Refill cymbalta 30mg  daily first, before 60mg  (Patient not taking: Reported on 04/16/2022) 30 capsule 0   DULoxetine (CYMBALTA) 60 MG capsule Take 1 capsule (60 mg total) by mouth daily. (Patient not taking: Reported on 04/16/2022) 30 capsule 11   No facility-administered medications prior to visit.    PAST MEDICAL HISTORY: Past Medical History:  Diagnosis Date   Hyperlipidemia    Hypertension     PAST SURGICAL  HISTORY: History reviewed. No pertinent surgical history.  FAMILY HISTORY: Family History  Problem Relation Age of Onset   Alzheimer's disease Mother    Heart attack Father    Parkinson's disease Brother     SOCIAL HISTORY: Social History   Socioeconomic History   Marital status: Widowed    Spouse name: Not on file   Number of children: Not on file   Years of education: Not on file   Highest education level: Not on file  Occupational History   Not on file  Tobacco Use   Smoking status: Never   Smokeless tobacco: Not on file  Substance and Sexual Activity   Alcohol use: No    Alcohol/week: 0.0 standard drinks of alcohol   Drug use: No   Sexual activity: Not on file  Other Topics Concern   Not on file  Social History Narrative   Not on file   Social Determinants of Health   Financial Resource Strain: Not on file  Food Insecurity: Not on file  Transportation Needs: Not on file  Physical Activity: Not on file  Stress: Not on file  Social Connections: Not on file  Intimate Partner Violence: Not on file    PHYSICAL EXAM  GENERAL EXAM/CONSTITUTIONAL: Vitals:  Vitals:   04/16/22 1550  BP: 129/76  Pulse: (!) 111  Weight: 158 lb (71.7 kg)  Height: 5\' 9"  (1.753 m)    Body mass index is 23.33 kg/m. Wt Readings from Last 3 Encounters:  04/16/22 158 lb (71.7 kg)  03/06/22 158 lb (71.7 kg)  01/01/22 158 lb 8 oz (71.9 kg)   Patient is in no distress; well developed, nourished and groomed; neck is supple  EYES: Visual fields full to confrontation, Extraocular movements intacts,   MUSCULOSKELETAL: Gait, strength, tone, movements noted in Neurologic exam below  NEUROLOGIC: MENTAL STATUS:      No data to display         awake, alert, oriented to person, place and time recent and remote memory intact normal attention and concentration language fluent, comprehension intact, naming intact fund of knowledge appropriate  CRANIAL NERVE:  2nd, 3rd, 4th,  6th - pupils equal and reactive to light, visual fields full to confrontation, extraocular muscles intact, no nystagmus 5th - facial sensation symmetric 7th - facial strength symmetric 8th - hearing intact 9th - palate elevates symmetrically, uvula midline 11th - shoulder shrug symmetric 12th - tongue protrusion midline  MOTOR:  normal bulk and tone, full strength in the RUE, BLE. Left shoulder abduction 2/5, shoulder flexion/extension 2/5. Left elbow extension 5/5 and flexion 4/5 and left wrist flexion/extension 5/5. Finger intrinsics 5/5  SENSORY:  normal and symmetric to light touch  GAIT/STATION:  normal    DIAGNOSTIC DATA (LABS, IMAGING, TESTING) - I reviewed patient records, labs, notes, testing and imaging myself where available.  Lab Results  Component Value Date   WBC 8.2 06/22/2021  HGB 15.9 06/22/2021   HCT 45.0 06/22/2021   MCV 89.6 06/22/2021   PLT 219 06/22/2021      Component Value Date/Time   NA 139 06/22/2021 1246   K 4.8 06/22/2021 1246   CL 104 06/22/2021 1246   CO2 27 06/22/2021 1246   GLUCOSE 152 (H) 06/22/2021 1246   BUN 17 06/22/2021 1246   CREATININE 0.87 06/22/2021 1246   CREATININE 1.06 04/12/2015 1257   CALCIUM 9.2 06/22/2021 1246   PROT 8.0 06/22/2021 1246   ALBUMIN 4.7 06/22/2021 1246   AST 25 06/22/2021 1246   ALT 23 06/22/2021 1246   ALKPHOS 58 06/22/2021 1246   BILITOT 1.1 06/22/2021 1246   GFRNONAA >60 06/22/2021 1246   GFRAA >60 05/10/2015 1536   No results found for: "CHOL", "HDL", "LDLCALC", "LDLDIRECT", "TRIG", "CHOLHDL" No results found for: "HGBA1C" No results found for: "VITAMINB12" No results found for: "TSH"   MRI Brain 06/22/2021 1. No acute intracranial abnormality. 2. Periventricular and subcortical T2 hyperintensities bilaterally are mildly advanced for age. The finding is nonspecific but can be seen in the setting of chronic microvascular ischemia, a demyelinating process such as multiple sclerosis, vasculitis,  complicated migraine headaches, or as the sequelae of a prior infectious or inflammatory process. 3.  Cerebral Atrophy (ICD10-G31.9).   MRI Cervical spine 06/22/2021 1. C4-C5 severe spinal canal stenosis and severe bilateral neural foraminal narrowing. 2. C5-C6 severe spinal canal stenosis with severe left and mild right neural foraminal narrowing. 3. Additional mild neural foraminal narrowing bilaterally at C2-C3, on the right at C3-C4, and on the left at C7-T1.   EMG/NCS 07/11/21 This is an abnormal study.  There is electrodiagnostic evidence of active left cervical radiculopathy, mainly involving left C5, C6 more than C7; mild chronic right cervical radiculopathy involving C5-6.  There is also evidence of moderate right carpal tunnel syndromes.  There is chronic bilateral lumbosacral radiculopathy, involving L4-5 S1, right worse than left. There is no evidence of large fiber peripheral neuropathy.   EMG/NCS 03/06/22 This is an abnormal study.  There is electrodiagnostic evidence of active and chronic left C5-6-7 more than C8, T1 cervical radiculopathy.  There is also evidence of chronic right cervical, bilateral lumbosacral radiculopathy. Compared to previous study in April 2023, prior to his cervical decompression, there was no significant change.   ASSESSMENT AND PLAN  77 y.o. year old male with hypertension, hyperlipidemia, diabetes mellitus who is presenting for follow up for cervical radiculopathy and myelopathy.  He is status post surgery, recovering well from surgery standpoint but still having left-sided weakness.  His repeat EMG did not show any improvement.  He also completed PT with no improvement, at this time I have informed patient that there is very slim chance of recovery.   Currently he is complaining of neck stiffness, he was prescribed tizanidine but has not started it.  I have recommended patient to start the tizanidine, I will also refer him to PT for neck exercise.   Advised him to continue following up with surgeon and to return as needed.    1. Cervical myelopathy (HCC)   2. Cervical radiculopathy   3. Cervicalgia   4. Neck stiffness   5. Shoulder weakness      There are no Patient Instructions on file for this visit.  No orders of the defined types were placed in this encounter.   No orders of the defined types were placed in this encounter.   Return if symptoms worsen or fail to improve.  I have spent a total of 30 minutes dedicated to this patient today, preparing to see patient, performing a medically appropriate examination and evaluation, ordering tests and/or medications and procedures, and counseling and educating the patient/family/caregiver; independently interpreting result and communicating results to the family/patient/caregiver; and documenting clinical information in the electronic medical record.   Alric Ran, MD 04/16/2022, 5:11 PM  [facility] [address], [place], [place] [address] [phone]

## 2022-04-19 ENCOUNTER — Ambulatory Visit: Payer: Medicare Other | Attending: Family Medicine

## 2022-04-19 DIAGNOSIS — M5412 Radiculopathy, cervical region: Secondary | ICD-10-CM | POA: Insufficient documentation

## 2022-04-19 DIAGNOSIS — M436 Torticollis: Secondary | ICD-10-CM | POA: Insufficient documentation

## 2022-04-19 DIAGNOSIS — R293 Abnormal posture: Secondary | ICD-10-CM | POA: Diagnosis not present

## 2022-04-19 DIAGNOSIS — M542 Cervicalgia: Secondary | ICD-10-CM | POA: Diagnosis not present

## 2022-04-19 NOTE — Therapy (Signed)
OUTPATIENT PHYSICAL THERAPY CERVICAL EVALUATION   Patient Name: Maxwell Thomas MRN: [id number] DOB:1945/10/31, 77 y.o., male Today's Date: 04/19/2022  END OF SESSION:  PT End of Session - 04/19/22 1305     Visit Number 1    Number of Visits 2    Date for PT Re-Evaluation 05/17/22    Authorization Type BCBS Medicare    PT Start Time 1315    PT Stop Time 1358    PT Time Calculation (min) 43 min    Activity Tolerance Patient tolerated treatment well    Behavior During Therapy WFL for tasks assessed/performed             Past Medical History:  Diagnosis Date   Hyperlipidemia    Hypertension    History reviewed. No pertinent surgical history. Patient Active Problem List   Diagnosis Date Noted   Cervical radiculopathy 03/06/2022   Gait abnormality 07/11/2021   Cervical myelopathy ([place]) 07/11/2021   Left arm weakness 07/11/2021   Diabetes ([place]) 04/12/2015   Nephrolithiasis 04/12/2015   Gallstone 04/12/2015   Essential hypertension 04/12/2015    PCP: L. Donnie Coffin, MD  REFERRING PROVIDER: Alric Ran, MD  REFERRING DIAG: [phone] (ICD-10-CM) - Cervical radiculopathy M54.2 (ICD-10-CM) - Cervicalgia M43.6 (ICD-10-CM) - Neck stiffness  THERAPY DIAG:  Abnormal posture  Cervicalgia  Rationale for Evaluation and Treatment: Rehabilitation  ONSET DATE: 04/16/2022 referral  SUBJECTIVE:                                                                                                                                                                                                         SUBJECTIVE STATEMENT: Patient is familiar to this clinic. He previously had PT that was unsuccessful for treating his L UE weakness due to cervical radiculopathy. Patient with increased neck stiffness, having to turn his whole body to check his blind spot while driving, no pain. Had a muscle relaxant prescribed, but hasn't started it yet. Does not report any pain in arm or neck. Does report  some "mild headaches." Patient is fused at C4-C6.   PERTINENT HISTORY:  HTN, HLD, ACDF sx   PAIN:  Are you having pain? No  PRECAUTIONS: None  WEIGHT BEARING RESTRICTIONS: No  FALLS:  Has patient fallen in last 6 months? No  LIVING ENVIRONMENT: Lives with: lives alone Lives in: House/apartment Stairs: No Has following equipment at home: Single point cane and Environmental consultant - 2 wheeled  OCCUPATION: retired  PLOF: Independent  PATIENT GOALS: "improve my neck stiffness"   OBJECTIVE:   DIAGNOSTIC FINDINGS:  03/06/22 EMG: This  is an abnormal study.  There is electrodiagnostic evidence of active and chronic left C5-6-7 more than C8, T1 cervical radiculopathy.  There is also evidence of chronic right cervical, bilateral lumbosacral radiculopathy.   Compared to previous study in April 2023, prior to his cervical decompression, there was no significant change.    PATIENT SURVEYS:  NDI 34%  COGNITION: Overall cognitive status: Within functional limits for tasks assessed  SENSATION: WFL  POSTURE: rounded shoulders, forward head, increased thoracic kyphosis, posterior pelvic tilt, and flexed trunk   CERVICAL ROM:  **patient with resting position of 10* flexion Active ROM A/PROM (deg) eval  Flexion 40*  Extension 3*  Right lateral flexion 12*  Left lateral flexion 9*  Right rotation 17*  Left rotation 25*   (Blank rows = not tested)  UPPER EXTREMITY ROM: L UE limited due to paresis and poor cervical posture   TODAY'S TREATMENT:                                                                                                                              N/A eval   PATIENT EDUCATION:  Education details: PT POC, exam findings, implications of cervical fusion Person educated: Patient Education method: Explanation Education comprehension: verbalized understanding  HOME EXERCISE PROGRAM: Access Code: [email or web address] URL: [email or web address] Date:  04/19/2022 Prepared by: Estevan Ryder  Exercises - Seated Scapular Retraction  - 1 x daily - 7 x weekly - 3 sets - 10 reps - Seated Cervical Retraction  - 1 x daily - 7 x weekly - 3 sets - 10 reps - 3-5s hold - Supine Chin Tuck  - 1 x daily - 7 x weekly - 3 sets - 10 reps - 3-5s hold - Sternocleidomastoid Stretch  - 1 x daily - 7 x weekly - 3 sets - 5 reps - 20-30s hold - Seated Upper Trap Stretch  - 1 x daily - 7 x weekly - 3 sets - 5 reps - 20-30s hold  ASSESSMENT:  CLINICAL IMPRESSION: Patient is a 77 y.o. male who was seen today for physical therapy evaluation and treatment for neck stiffness and pain. He had an ACDF surgery last year at C4-C6 levels and does have remaining cervical radiculopathy effecting his LUE profoundly. Patient describing limited ability to turn his head to check his blind spot while driving, for example. PT explaining, that he is fused at 2 cervical segments and so is expected to have slightly diminished ROM.  Patient with resting posture of at least 10* cervical flexion further contributing to limited cervical ROM and even UE ROM. Discussed extensively with patient the importance of maintaining a good posture for improved kinematics of spine and UE. Provided patient with HEP to stretch neck flexors and strengthen neck extensors. Patient to return in 2 weeks after completing the HEP to assess for effectiveness and adherence to HEP.   OBJECTIVE IMPAIRMENTS: decreased knowledge of condition, decreased mobility, decreased ROM, decreased strength, hypomobility, increased fascial  restrictions, impaired UE functional use, and postural dysfunction.   ACTIVITY LIMITATIONS: carrying, lifting, and reach over head  PARTICIPATION LIMITATIONS: interpersonal relationship, driving, community activity, and yard work  PERSONAL FACTORS: Age, Behavior pattern, Past/current experiences, Time since onset of injury/illness/exacerbation, and 1 comorbidity: previous ACDF  are also affecting  patient's functional outcome.   REHAB POTENTIAL: Fair time since onset  CLINICAL DECISION MAKING: Stable/uncomplicated  EVALUATION COMPLEXITY: Low   GOALS: Goals reviewed with patient? Yes  SHORT TERM GOALS: = LTG based on POC length   LONG TERM GOALS: Target date: 05/17/22  Pt will be independent with final HEP for improved posture  Baseline: provided Goal status: INITIAL  2.  Patient will improve NDI score to </= 25% to demonstrate improved symptom report and function Baseline: 34% Goal status: INITIAL   PLAN:  PT FREQUENCY: every other week  PT DURATION: 2 weeks  PLANNED INTERVENTIONS: Therapeutic exercises, Therapeutic activity, Neuromuscular re-education, Balance training, Gait training, Patient/Family education, Self Care, Joint mobilization, Stair training, Vestibular training, DME instructions, Aquatic Therapy, Dry Needling, Cryotherapy, Moist heat, Taping, Manual therapy, and Re-evaluation  PLAN FOR NEXT SESSION: how's HEP    Debbora Dus, PT Debbora Dus, PT, DPT, CBIS  04/19/2022, 2:00 PM

## 2022-05-14 DIAGNOSIS — L570 Actinic keratosis: Secondary | ICD-10-CM | POA: Diagnosis not present

## 2022-05-14 DIAGNOSIS — L821 Other seborrheic keratosis: Secondary | ICD-10-CM | POA: Diagnosis not present

## 2022-05-14 DIAGNOSIS — C44319 Basal cell carcinoma of skin of other parts of face: Secondary | ICD-10-CM | POA: Diagnosis not present

## 2022-05-14 DIAGNOSIS — D225 Melanocytic nevi of trunk: Secondary | ICD-10-CM | POA: Diagnosis not present

## 2022-05-14 DIAGNOSIS — Z08 Encounter for follow-up examination after completed treatment for malignant neoplasm: Secondary | ICD-10-CM | POA: Diagnosis not present

## 2022-05-14 DIAGNOSIS — L814 Other melanin hyperpigmentation: Secondary | ICD-10-CM | POA: Diagnosis not present

## 2022-06-08 ENCOUNTER — Other Ambulatory Visit: Payer: Self-pay

## 2022-06-08 ENCOUNTER — Emergency Department (HOSPITAL_COMMUNITY): Payer: Medicare Other

## 2022-06-08 ENCOUNTER — Emergency Department (HOSPITAL_COMMUNITY)
Admission: EM | Admit: 2022-06-08 | Discharge: 2022-06-09 | Disposition: A | Discharge status: Home / Self Care | Payer: Medicare Other | Attending: Emergency Medicine | Admitting: Emergency Medicine

## 2022-06-08 ENCOUNTER — Encounter (HOSPITAL_COMMUNITY): Payer: Self-pay | Admitting: Emergency Medicine

## 2022-06-08 DIAGNOSIS — W010XXA Fall on same level from slipping, tripping and stumbling without subsequent striking against object, initial encounter: Secondary | ICD-10-CM | POA: Insufficient documentation

## 2022-06-08 DIAGNOSIS — R000 Tachycardia, unspecified: Secondary | ICD-10-CM | POA: Diagnosis not present

## 2022-06-08 DIAGNOSIS — U071 COVID-19: Secondary | ICD-10-CM | POA: Diagnosis not present

## 2022-06-08 DIAGNOSIS — R531 Weakness: Secondary | ICD-10-CM | POA: Diagnosis not present

## 2022-06-08 DIAGNOSIS — I10 Essential (primary) hypertension: Secondary | ICD-10-CM | POA: Insufficient documentation

## 2022-06-08 DIAGNOSIS — Z79899 Other long term (current) drug therapy: Secondary | ICD-10-CM | POA: Insufficient documentation

## 2022-06-08 DIAGNOSIS — W19XXXA Unspecified fall, initial encounter: Secondary | ICD-10-CM | POA: Diagnosis not present

## 2022-06-08 DIAGNOSIS — R059 Cough, unspecified: Secondary | ICD-10-CM | POA: Diagnosis not present

## 2022-06-08 LAB — CBC WITH DIFFERENTIAL/PLATELET
Abs Immature Granulocytes: 0.04 10*3/uL (ref 0.00–0.07)
Basophils Absolute: 0 10*3/uL (ref 0.0–0.1)
Basophils Relative: 0 %
Eosinophils Absolute: 0 10*3/uL (ref 0.0–0.5)
Eosinophils Relative: 0 %
HCT: 40.7 % (ref 39.0–52.0)
Hemoglobin: 14.1 g/dL (ref 13.0–17.0)
Immature Granulocytes: 0 %
Lymphocytes Relative: 10 %
Lymphs Abs: 1 10*3/uL (ref 0.7–4.0)
MCH: 31.3 pg (ref 26.0–34.0)
MCHC: 34.6 g/dL (ref 30.0–36.0)
MCV: 90.4 fL (ref 80.0–100.0)
Monocytes Absolute: 1.5 10*3/uL — ABNORMAL HIGH (ref 0.1–1.0)
Monocytes Relative: 14 %
Neutro Abs: 7.9 10*3/uL — ABNORMAL HIGH (ref 1.7–7.7)
Neutrophils Relative %: 76 %
Platelets: 190 10*3/uL (ref 150–400)
RBC: 4.5 MIL/uL (ref 4.22–5.81)
RDW: 13.5 % (ref 11.5–15.5)
WBC: 10.5 10*3/uL (ref 4.0–10.5)
nRBC: 0 % (ref 0.0–0.2)

## 2022-06-08 LAB — ETHANOL: Alcohol, Ethyl (B): <10 mg/dL (ref <10)

## 2022-06-08 MED ORDER — SODIUM CHLORIDE 0.9 % IV BOLUS
1000.0000 mL | Freq: Once | INTRAVENOUS | Status: AC
  Administered 2022-06-08: 1000 mL via INTRAVENOUS

## 2022-06-08 NOTE — ED Triage Notes (Signed)
The Patient presents from home due to weakness. He had a fall about 3 hours ago and is concerned because he had a lot of trouble getting up. He reports bilateral leg weakness. Denies pain or injury from the fall. He did not hit his head. EMS noted his home appeared as if it belonged to a hoarder. Multiple dead roaches were found on the patient.   EMS vitals: 150/68 BP 98 HR 99% SPO2 on room air 166 CBG

## 2022-06-08 NOTE — ED Provider Notes (Signed)
EMERGENCY DEPARTMENT AT [facility] Provider Note   CSN: [id number] Arrival date & time: 06/08/22  2119     History {Add pertinent medical, surgical, social history, OB history to HPI:1} Chief Complaint  Patient presents with   Fall   Weakness    Maxwell Thomas is a 77 y.o. male.  77 year old male presents to the emergency department from home with complaints of generalized weakness.  Reports that he lost his balance in his hallway and fell approximately 3 hours prior to arrival.  He was unable to get up because he did not have the strength.  He did manage to army crawl down the hallway to, somehow, alert authorities.  EMS responded and noted his home to appear as though it belong to a hoarder.  Multiple dead roaches were found on the patient on arrival to the ED.  The patient denies any pain at present.  Denies URI symptoms, chest pain, shortness of breath, abdominal pain, recent fevers, nausea or vomiting.  States he has been eating and drinking regularly.  Denies any known sick contacts.   Fall  Weakness      Home Medications Prior to Admission medications   Medication Sig Start Date End Date Taking? Authorizing Provider  DULoxetine (CYMBALTA) 30 MG capsule Take 1 capsule (30 mg total) by mouth daily. Refill cymbalta 30mg  daily first, before 60mg  Patient not taking: Reported on 04/16/2022 03/06/22   Marcial Pacas, MD  DULoxetine (CYMBALTA) 60 MG capsule Take 1 capsule (60 mg total) by mouth daily. Patient not taking: Reported on 04/16/2022 03/06/22   Marcial Pacas, MD  hydrochlorothiazide (HYDRODIURIL) 25 MG tablet Take 25 mg by mouth daily.    [provider]  metFORMIN (GLUCOPHAGE) 500 MG tablet Take 500 mg by mouth at bedtime.    [provider]  metoprolol succinate (TOPROL-XL) 50 MG 24 hr tablet Take 50 mg by mouth daily. Take with or immediately following a meal.    [provider]  rosuvastatin (CRESTOR) 10 MG tablet Take 10 mg  by mouth daily.    [provider]      Allergies    Patient has no known allergies.    Review of Systems   Review of Systems  Neurological:  Positive for weakness.  Ten systems reviewed and are negative for acute change, except as noted in the HPI.    Physical Exam Updated Vital Signs BP (!) 113/96 (BP Location: Left Arm)   Pulse 97   Temp 98.2 F (36.8 C) (Oral)   Resp 18   Ht 5\' 9"  (1.753 m)   Wt 72.6 kg   SpO2 98%   BMI 23.63 kg/m   Physical Exam Vitals and nursing note reviewed.  Constitutional:      General: He is not in acute distress.    Appearance: He is well-developed. He is not diaphoretic.     Comments: Disheveled appearing  HENT:     Head: Normocephalic and atraumatic.  Eyes:     General: No scleral icterus.    Conjunctiva/sclera: Conjunctivae normal.     Comments: Nonicteric sclera   Cardiovascular:     Rate and Rhythm: Normal rate and regular rhythm.     Pulses: Normal pulses.  Pulmonary:     Effort: Pulmonary effort is normal. No respiratory distress.     Comments: Respirations even and unlabored Abdominal:     General: There is no distension.  Musculoskeletal:        General: Normal range  of motion.     Cervical back: Normal range of motion.  Skin:    General: Skin is warm and dry.     Coloration: Skin is not pale.     Findings: No erythema or rash.     Comments: Facial telangiectasias. Faint yellowing of skin. Rug burn to elbows, likely from army crawling PTA.  Neurological:     Mental Status: He is alert and oriented to person, place, and time.     Comments: GCS 15. Speech is goal oriented. No deficits appreciated to CN III-XII; symmetric eyebrow raise, no facial drooping, tongue midline. Patient has equal grip strength bilaterally with 5/5 strength against resistance in all major muscle groups bilaterally. Sensation to light touch intact. Gait not assessed.  Psychiatric:        Behavior: Behavior normal.     ED Results /  Procedures / Treatments   Labs (all labs ordered are listed, but only abnormal results are displayed) Labs Reviewed  RESP PANEL BY RT-PCR (RSV, FLU A&B, COVID)  [id number]  CBC WITH DIFFERENTIAL/PLATELET  COMPREHENSIVE METABOLIC PANEL  ETHANOL  RAPID URINE DRUG SCREEN, HOSP PERFORMED  URINALYSIS, ROUTINE W REFLEX MICROSCOPIC  MAGNESIUM    EKG None  Radiology No results found.  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  Seems to live in a reclusive manner, based on EMS reports.  He does not seem acutely encephalopathic.  Is alert and oriented.  Initial evaluation will remain broad with labs, CXR, head CT imaging.  No focal neurologic deficits to suggest acute CVA. [KH]    Clinical Course User Index [KH] Antonietta Breach, PA-C   {   Click here for ABCD2, HEART and other calculatorsREFRESH Note before signing :1}                          Medical Decision Making Amount and/or Complexity of Data Reviewed Labs: ordered. Radiology: ordered.   ***  {Document critical care time when appropriate:1} {Document review of labs and clinical decision tools ie heart score, Chads2Vasc2 etc:1}  {Document your independent review of radiology images, and any outside records:1} {Document your discussion with family members, caretakers, and with consultants:1} {Document social determinants of health affecting pt's care:1} {Document your decision making why or why not admission, treatments were needed:1} Final Clinical Impression(s) / ED Diagnoses Final diagnoses:  None    Rx / DC Orders ED Discharge Orders     None

## 2022-06-09 LAB — RAPID URINE DRUG SCREEN, HOSP PERFORMED
Amphetamines: NOT DETECTED
Barbiturates: NOT DETECTED
Benzodiazepines: NOT DETECTED
Cocaine: NOT DETECTED
Opiates: NOT DETECTED
Tetrahydrocannabinol: NOT DETECTED

## 2022-06-09 LAB — RESP PANEL BY RT-PCR (RSV, FLU A&B, COVID)  RVPGX2
Influenza A by PCR: NEGATIVE
Influenza B by PCR: NEGATIVE
Resp Syncytial Virus by PCR: NEGATIVE
SARS Coronavirus 2 by RT PCR: POSITIVE — AB

## 2022-06-09 LAB — URINALYSIS, ROUTINE W REFLEX MICROSCOPIC
Bilirubin Urine: NEGATIVE
Glucose, UA: NEGATIVE mg/dL
Ketones, ur: 20 mg/dL — AB
Leukocytes,Ua: NEGATIVE
Nitrite: NEGATIVE
Protein, ur: 100 mg/dL — AB
Specific Gravity, Urine: 1.026 (ref 1.005–1.030)
pH: 5 (ref 5.0–8.0)

## 2022-06-09 LAB — COMPREHENSIVE METABOLIC PANEL
ALT: 23 U/L (ref 0–44)
AST: 56 U/L — ABNORMAL HIGH (ref 15–41)
Albumin: 3.2 g/dL — ABNORMAL LOW (ref 3.5–5.0)
Alkaline Phosphatase: 36 U/L — ABNORMAL LOW (ref 38–126)
Anion gap: 8 (ref 5–15)
BUN: 19 mg/dL (ref 8–23)
CO2: 22 mmol/L (ref 22–32)
Calcium: 7.6 mg/dL — ABNORMAL LOW (ref 8.9–10.3)
Chloride: 107 mmol/L (ref 98–111)
Creatinine, Ser: 0.68 mg/dL (ref 0.61–1.24)
GFR, Estimated: >60 mL/min (ref >60)
Glucose, Bld: 127 mg/dL — ABNORMAL HIGH (ref 70–99)
Potassium: 3.2 mmol/L — ABNORMAL LOW (ref 3.5–5.1)
Sodium: 137 mmol/L (ref 135–145)
Total Bilirubin: 1 mg/dL (ref 0.3–1.2)
Total Protein: 5.7 g/dL — ABNORMAL LOW (ref 6.5–8.1)

## 2022-06-09 LAB — MAGNESIUM: Magnesium: 1.9 mg/dL (ref 1.7–2.4)

## 2022-06-09 MED ORDER — ACETAMINOPHEN 500 MG PO TABS
1000.0000 mg | ORAL_TABLET | Freq: Once | ORAL | Status: AC
  Administered 2022-06-09: 1000 mg via ORAL
  Filled 2022-06-09: qty 2

## 2022-06-09 MED ORDER — SODIUM CHLORIDE 0.9 % IV BOLUS
1000.0000 mL | Freq: Once | INTRAVENOUS | Status: AC
  Administered 2022-06-09: 1000 mL via INTRAVENOUS

## 2022-06-09 NOTE — ED Notes (Signed)
Pt tolerating ambulation. Pt sats 98%

## 2022-06-09 NOTE — ED Notes (Signed)
Pt tolerating fluids.   

## 2022-06-09 NOTE — Discharge Instructions (Addendum)
You have tested positive for COVID today. Quarantine away from other individuals for 5 days, after which time you should remain masked in public for an additional 5 days. Take tylenol for fever, headaches, body aches. Drink plenty of fluids to prevent dehydration. You may continue to use other over-the-counter remedies for symptom control, if desired. Return for new or concerning symptoms such as worsening shortness of breath, coughing up blood, persistent vomiting, loss of consciousness.

## 2022-06-18 DIAGNOSIS — R531 Weakness: Secondary | ICD-10-CM | POA: Diagnosis not present

## 2022-06-18 DIAGNOSIS — S91209A Unspecified open wound of unspecified toe(s) with damage to nail, initial encounter: Secondary | ICD-10-CM | POA: Diagnosis not present

## 2022-06-27 DIAGNOSIS — K080 Exfoliation of teeth due to systemic causes: Secondary | ICD-10-CM | POA: Diagnosis not present

## 2022-07-03 DIAGNOSIS — K080 Exfoliation of teeth due to systemic causes: Secondary | ICD-10-CM | POA: Diagnosis not present

## 2022-07-17 DIAGNOSIS — K080 Exfoliation of teeth due to systemic causes: Secondary | ICD-10-CM | POA: Diagnosis not present

## 2022-07-29 DIAGNOSIS — K080 Exfoliation of teeth due to systemic causes: Secondary | ICD-10-CM | POA: Diagnosis not present

## 2022-08-13 DIAGNOSIS — E1140 Type 2 diabetes mellitus with diabetic neuropathy, unspecified: Secondary | ICD-10-CM | POA: Diagnosis not present

## 2022-08-13 DIAGNOSIS — I10 Essential (primary) hypertension: Secondary | ICD-10-CM | POA: Diagnosis not present

## 2022-08-13 DIAGNOSIS — B351 Tinea unguium: Secondary | ICD-10-CM | POA: Diagnosis not present

## 2022-08-13 DIAGNOSIS — L299 Pruritus, unspecified: Secondary | ICD-10-CM | POA: Diagnosis not present

## 2022-09-17 DIAGNOSIS — M4712 Other spondylosis with myelopathy, cervical region: Secondary | ICD-10-CM | POA: Diagnosis not present

## 2022-11-13 DIAGNOSIS — L821 Other seborrheic keratosis: Secondary | ICD-10-CM | POA: Diagnosis not present

## 2022-11-13 DIAGNOSIS — L570 Actinic keratosis: Secondary | ICD-10-CM | POA: Diagnosis not present

## 2022-11-13 DIAGNOSIS — C4441 Basal cell carcinoma of skin of scalp and neck: Secondary | ICD-10-CM | POA: Diagnosis not present

## 2022-11-13 DIAGNOSIS — L814 Other melanin hyperpigmentation: Secondary | ICD-10-CM | POA: Diagnosis not present

## 2022-11-13 DIAGNOSIS — Z08 Encounter for follow-up examination after completed treatment for malignant neoplasm: Secondary | ICD-10-CM | POA: Diagnosis not present

## 2022-11-13 DIAGNOSIS — D225 Melanocytic nevi of trunk: Secondary | ICD-10-CM | POA: Diagnosis not present

## 2023-01-01 DIAGNOSIS — K080 Exfoliation of teeth due to systemic causes: Secondary | ICD-10-CM | POA: Diagnosis not present

## 2023-01-06 DIAGNOSIS — K080 Exfoliation of teeth due to systemic causes: Secondary | ICD-10-CM | POA: Diagnosis not present

## 2023-01-21 DIAGNOSIS — L570 Actinic keratosis: Secondary | ICD-10-CM | POA: Diagnosis not present

## 2023-01-21 DIAGNOSIS — L578 Other skin changes due to chronic exposure to nonionizing radiation: Secondary | ICD-10-CM | POA: Diagnosis not present

## 2023-01-21 DIAGNOSIS — Z08 Encounter for follow-up examination after completed treatment for malignant neoplasm: Secondary | ICD-10-CM | POA: Diagnosis not present

## 2023-01-21 DIAGNOSIS — Z85828 Personal history of other malignant neoplasm of skin: Secondary | ICD-10-CM | POA: Diagnosis not present

## 2023-01-21 DIAGNOSIS — L728 Other follicular cysts of the skin and subcutaneous tissue: Secondary | ICD-10-CM | POA: Diagnosis not present

## 2023-01-23 DIAGNOSIS — K080 Exfoliation of teeth due to systemic causes: Secondary | ICD-10-CM | POA: Diagnosis not present

## 2023-02-14 DIAGNOSIS — H524 Presbyopia: Secondary | ICD-10-CM | POA: Diagnosis not present

## 2023-02-18 DIAGNOSIS — E119 Type 2 diabetes mellitus without complications: Secondary | ICD-10-CM | POA: Diagnosis not present

## 2023-02-18 DIAGNOSIS — E782 Mixed hyperlipidemia: Secondary | ICD-10-CM | POA: Diagnosis not present

## 2023-02-18 DIAGNOSIS — I10 Essential (primary) hypertension: Secondary | ICD-10-CM | POA: Diagnosis not present

## 2023-02-19 DIAGNOSIS — Z0000 Encounter for general adult medical examination without abnormal findings: Secondary | ICD-10-CM | POA: Diagnosis not present

## 2023-02-19 DIAGNOSIS — I10 Essential (primary) hypertension: Secondary | ICD-10-CM | POA: Diagnosis not present

## 2023-02-19 DIAGNOSIS — E119 Type 2 diabetes mellitus without complications: Secondary | ICD-10-CM | POA: Diagnosis not present

## 2023-02-19 DIAGNOSIS — G959 Disease of spinal cord, unspecified: Secondary | ICD-10-CM | POA: Diagnosis not present

## 2023-02-19 DIAGNOSIS — E782 Mixed hyperlipidemia: Secondary | ICD-10-CM | POA: Diagnosis not present

## 2023-03-26 DIAGNOSIS — K080 Exfoliation of teeth due to systemic causes: Secondary | ICD-10-CM | POA: Diagnosis not present

## 2023-03-31 DIAGNOSIS — K080 Exfoliation of teeth due to systemic causes: Secondary | ICD-10-CM | POA: Diagnosis not present

## 2023-07-02 DIAGNOSIS — K080 Exfoliation of teeth due to systemic causes: Secondary | ICD-10-CM | POA: Diagnosis not present

## 2023-07-08 DIAGNOSIS — Z85828 Personal history of other malignant neoplasm of skin: Secondary | ICD-10-CM | POA: Diagnosis not present

## 2023-07-08 DIAGNOSIS — Z08 Encounter for follow-up examination after completed treatment for malignant neoplasm: Secondary | ICD-10-CM | POA: Diagnosis not present

## 2023-07-08 DIAGNOSIS — L814 Other melanin hyperpigmentation: Secondary | ICD-10-CM | POA: Diagnosis not present

## 2023-07-08 DIAGNOSIS — D225 Melanocytic nevi of trunk: Secondary | ICD-10-CM | POA: Diagnosis not present

## 2023-07-08 DIAGNOSIS — C44519 Basal cell carcinoma of skin of other part of trunk: Secondary | ICD-10-CM | POA: Diagnosis not present

## 2023-07-08 DIAGNOSIS — L570 Actinic keratosis: Secondary | ICD-10-CM | POA: Diagnosis not present

## 2023-08-20 DIAGNOSIS — E1140 Type 2 diabetes mellitus with diabetic neuropathy, unspecified: Secondary | ICD-10-CM | POA: Diagnosis not present

## 2023-08-20 DIAGNOSIS — E782 Mixed hyperlipidemia: Secondary | ICD-10-CM | POA: Diagnosis not present

## 2023-08-20 DIAGNOSIS — E119 Type 2 diabetes mellitus without complications: Secondary | ICD-10-CM | POA: Diagnosis not present

## 2023-08-20 DIAGNOSIS — I10 Essential (primary) hypertension: Secondary | ICD-10-CM | POA: Diagnosis not present

## 2023-11-25 DIAGNOSIS — K080 Exfoliation of teeth due to systemic causes: Secondary | ICD-10-CM | POA: Diagnosis not present

## 2023-11-29 DIAGNOSIS — Z23 Encounter for immunization: Secondary | ICD-10-CM | POA: Diagnosis not present

## 2024-01-05 DIAGNOSIS — K080 Exfoliation of teeth due to systemic causes: Secondary | ICD-10-CM | POA: Diagnosis not present

## 2024-01-14 DIAGNOSIS — L821 Other seborrheic keratosis: Secondary | ICD-10-CM | POA: Diagnosis not present

## 2024-01-14 DIAGNOSIS — D492 Neoplasm of unspecified behavior of bone, soft tissue, and skin: Secondary | ICD-10-CM | POA: Diagnosis not present

## 2024-01-14 DIAGNOSIS — D225 Melanocytic nevi of trunk: Secondary | ICD-10-CM | POA: Diagnosis not present

## 2024-01-14 DIAGNOSIS — L570 Actinic keratosis: Secondary | ICD-10-CM | POA: Diagnosis not present

## 2024-01-14 DIAGNOSIS — C44619 Basal cell carcinoma of skin of left upper limb, including shoulder: Secondary | ICD-10-CM | POA: Diagnosis not present

## 2024-01-14 DIAGNOSIS — L538 Other specified erythematous conditions: Secondary | ICD-10-CM | POA: Diagnosis not present

## 2024-01-14 DIAGNOSIS — L814 Other melanin hyperpigmentation: Secondary | ICD-10-CM | POA: Diagnosis not present

## 2024-01-14 DIAGNOSIS — C4441 Basal cell carcinoma of skin of scalp and neck: Secondary | ICD-10-CM | POA: Diagnosis not present

## 2024-01-14 DIAGNOSIS — Z08 Encounter for follow-up examination after completed treatment for malignant neoplasm: Secondary | ICD-10-CM | POA: Diagnosis not present

## 2024-02-23 DIAGNOSIS — E119 Type 2 diabetes mellitus without complications: Secondary | ICD-10-CM | POA: Diagnosis not present

## 2024-02-23 DIAGNOSIS — E782 Mixed hyperlipidemia: Secondary | ICD-10-CM | POA: Diagnosis not present

## 2024-02-23 DIAGNOSIS — I10 Essential (primary) hypertension: Secondary | ICD-10-CM | POA: Diagnosis not present
